# Patient Record
Sex: Male | Born: 1975 | Race: White | Hispanic: No | Marital: Married | Smoking: Former smoker
Health system: Southern US, Community
[De-identification: ages and names within clinical notes are randomized; demographics above are authoritative.]

## PROBLEM LIST (undated history)

## (undated) DIAGNOSIS — L719 Rosacea, unspecified: Secondary | ICD-10-CM

## (undated) DIAGNOSIS — M7022 Olecranon bursitis, left elbow: Secondary | ICD-10-CM

## (undated) DIAGNOSIS — F411 Generalized anxiety disorder: Secondary | ICD-10-CM

## (undated) HISTORY — DX: Olecranon bursitis, left elbow: M70.22

## (undated) HISTORY — DX: Rosacea, unspecified: L71.9

## (undated) HISTORY — DX: Generalized anxiety disorder: F41.1

## (undated) HISTORY — PX: WISDOM TOOTH EXTRACTION: SHX21

---

## 2009-02-13 ENCOUNTER — Ambulatory Visit: Payer: Self-pay | Admitting: Family Medicine

## 2009-02-15 ENCOUNTER — Ambulatory Visit: Payer: Self-pay | Admitting: Family Medicine

## 2009-02-15 ENCOUNTER — Encounter: Payer: Self-pay | Admitting: Family Medicine

## 2009-02-15 DIAGNOSIS — B078 Other viral warts: Secondary | ICD-10-CM | POA: Insufficient documentation

## 2009-02-20 ENCOUNTER — Telehealth: Payer: Self-pay | Admitting: Family Medicine

## 2009-03-08 ENCOUNTER — Encounter: Payer: Self-pay | Admitting: Family Medicine

## 2009-06-16 ENCOUNTER — Ambulatory Visit: Payer: Self-pay | Admitting: Family Medicine

## 2009-06-16 DIAGNOSIS — M26609 Unspecified temporomandibular joint disorder, unspecified side: Secondary | ICD-10-CM | POA: Insufficient documentation

## 2010-11-21 ENCOUNTER — Telehealth: Payer: Self-pay | Admitting: Family Medicine

## 2010-11-21 NOTE — Telephone Encounter (Signed)
Patient called into triage nurse and is complaining of chest pain, upper back pain, SOB, indigestion, tight chest.  Pt has good weight of 180 and 5'10" height.  Pt.has appt. scheduled tomorrow afternoon with Dr.Metheney, and triage will leave appt. Onset:  1 1/2 weeks ago. Hx.  Asthma:  Has albuterol inhaler but hasn't used.  PGM and PGF Hx of heart problems.   Plan:  Pt instructed to proceed to local emergency room.  Pt at work in [place] and will call his wife, and go to [facility].  Please advise of any further recommendations.  SHT/triage

## 2010-11-22 ENCOUNTER — Telehealth: Payer: Self-pay | Admitting: Family Medicine

## 2010-11-22 ENCOUNTER — Ambulatory Visit: Payer: Self-pay | Admitting: Family Medicine

## 2010-11-22 ENCOUNTER — Encounter: Payer: Self-pay | Admitting: Family Medicine

## 2010-11-22 NOTE — Telephone Encounter (Signed)
Patient needs refill for cream sent to [facility] in [place], patient said pharmacy has faxed a request

## 2010-11-26 NOTE — Telephone Encounter (Signed)
Pt notified needs office visit for aldara crm since it has been over a year.  Already aware and appt sched by Victorino Dike.

## 2010-11-26 NOTE — Telephone Encounter (Signed)
Needs appt. Hasn't been seen in a over a year.

## 2010-11-26 NOTE — Telephone Encounter (Signed)
Pt called again today saying he had requested multiple times his Rx for Aldara External cream since 11-22-10.  Pharmacy had told him that they had also sent fax transmittal for request.   PLAN:  Researched.  Rx transmittal retrieved and last RF checked, and last OV checked.  Last OV 05/2009.  Last RF 06-07-2009.  Does pt need OV or okay to RF??  Please advise. Jarvis Newcomer, LPN [facility] Dimes

## 2010-11-30 ENCOUNTER — Encounter: Payer: Self-pay | Admitting: Family Medicine

## 2010-11-30 ENCOUNTER — Ambulatory Visit (INDEPENDENT_AMBULATORY_CARE_PROVIDER_SITE_OTHER): Payer: 59 | Admitting: Family Medicine

## 2010-11-30 VITALS — BP 120/72 | HR 87 | Ht 70.5 in | Wt 185.0 lb

## 2010-11-30 DIAGNOSIS — Z0000 Encounter for general adult medical examination without abnormal findings: Secondary | ICD-10-CM

## 2010-11-30 DIAGNOSIS — R1011 Right upper quadrant pain: Secondary | ICD-10-CM

## 2010-11-30 MED ORDER — IMIQUIMOD 5 % EX CREA: TOPICAL_CREAM | CUTANEOUS | Status: AC

## 2010-11-30 NOTE — Progress Notes (Signed)
  Subjective:    Patient ID: Dominic Howard, male    DOB: 04/11/1976, 35 y.o.   MRN: [id number]  HPI  Called last week with some CP, 1/10. Had been doing some yard work.  It was persistant for almost a week.  Had a pulling sensation that would come and go.  Started originally 2 months ago but happened again last weekend.  More on the right lower rib area.  Also some pain in the right upper chest and radiated under his shoulder blade. Went to the ED and told his "heart" was fine. No nausea. Occ heartburn and reflux but not daily.  Brett Albino will bother him.  Says occ will feel SOB like he has gas in his chest and if he can burp he feels some relief.   No recent asthma flare. Rarely uses his rescue inhaler  Needs refill on the imiquimod for genital warts.  Has had a few new lesions.   Review of Systems  Constitutional: Negative for fever, activity change, appetite change and unexpected weight change.  Respiratory: Positive for chest tightness and shortness of breath. Negative for apnea and wheezing.   Cardiovascular: Positive for chest pain. Negative for palpitations.  Neurological: Negative for dizziness and light-headedness.       Objective:   Physical Exam  Constitutional: He is oriented to person, place, and time. He appears well-developed and well-nourished.  HENT:  Head: Normocephalic and atraumatic.  Right Ear: External ear normal.  Left Ear: External ear normal.  Nose: Nose normal.  Mouth/Throat: Oropharynx is clear and moist.  Eyes: Conjunctivae and EOM are normal. Pupils are equal, round, and reactive to light.       Wears glasses.   Neck: Neck supple. No thyromegaly present.  Cardiovascular: Normal rate, regular rhythm and normal heart sounds.   Pulmonary/Chest: Effort normal and breath sounds normal.  Abdominal: Soft. Bowel sounds are normal. He exhibits no distension and no mass. There is tenderness. There is no rebound and no guarding.       Very mild tenderness in  the RUQ with deep palpation. No tenderness over the chest wall.   Musculoskeletal: Normal range of motion.  Lymphadenopathy:    He has no cervical adenopathy.  Neurological: He is alert and oriented to person, place, and time. He has normal reflexes. A cranial nerve deficit is present.  Skin: Skin is warm and dry. No rash noted. No erythema.  Psychiatric: He has a normal mood and affect.          Assessment & Plan:  CPE- Exam is normal.  Given slip for screenig labs. Encouraged healthy diet and reg exercise. BP and weight look greats  Genital warts - refill imiquimod  RUQ pain - the location and radiation to the shoulder blade is consistant with gallbladder but the pain is very  Low level and he has no nausea and not triggered with eating or not eating. Discussed could consider [place] of his GB for further eval or trial of tx for reflux. Labs unlikely to be revealing. He is feeling well today.  Can try zantac OTC and change his diet for now. If presists or not resolving then will get an [place] for further evaluation.

## 2010-11-30 NOTE — Patient Instructions (Signed)
Can avoid greasy, spicy, acidic foods. Also avoid overeating and soda and caffeine.  Can try zantac bid about 20 min before breakfast and dinner. If helping after 2 weeks then can continue for 6 weeks total and then taper off.  If pain starts to get worse then call and we can schedule an [place] for further evaluation.

## 2014-06-10 ENCOUNTER — Ambulatory Visit (INDEPENDENT_AMBULATORY_CARE_PROVIDER_SITE_OTHER): Payer: 59 | Admitting: Family Medicine

## 2014-06-10 ENCOUNTER — Encounter: Payer: Self-pay | Admitting: Family Medicine

## 2014-06-10 VITALS — BP 144/89 | HR 100 | Temp 98.0°F | Wt 193.0 lb

## 2014-06-10 DIAGNOSIS — A499 Bacterial infection, unspecified: Secondary | ICD-10-CM

## 2014-06-10 DIAGNOSIS — J329 Chronic sinusitis, unspecified: Secondary | ICD-10-CM

## 2014-06-10 DIAGNOSIS — B9689 Other specified bacterial agents as the cause of diseases classified elsewhere: Secondary | ICD-10-CM

## 2014-06-10 MED ORDER — DOXYCYCLINE HYCLATE 100 MG PO TABS: ORAL_TABLET | ORAL | Status: AC

## 2014-06-10 NOTE — Progress Notes (Signed)
CC: Dominic Howard is a 38 y.o. male is here for Cough   Subjective: HPI:  Patient has not been seen in our office for over 3 years  facial pressure in the forehead with subjective postnasal drip and nonproductive cough that has been present for the past 12 days. Symptoms seem to be getting better last week and but have rebounded now moderate in severity. No benefit from Mucinex or over-the-counter cold medications. Nothing else seems to make better or worse it's present all hours of the day but worse first thing in the morning. Questionable fever yesterday. Denies chills, night sweats, wheezing, shortness of breath, blood in sputum, motor or sensory disturbances nor rash   Review Of Systems Outlined In HPI  No past medical history on file.  Past Surgical History  Procedure Laterality Date  . Wisdom tooth extraction     Family History  Problem Relation Age of Onset  . Ovarian cancer Maternal Aunt   . Stroke Maternal Grandfather   . Heart attack Maternal Grandfather   . Brain cancer Paternal Aunt   . Bone cancer Paternal Uncle   . Heart disease Paternal Grandfather     History   Social History  . Marital Status: Married    Spouse Name: Angelica    Number of Children: 1   . Years of Education: N/A   Occupational History  . sales     Social History Main Topics  . Smoking status: Former Smoker    Types: Cigarettes    Quit date: 11/26/2009  . Smokeless tobacco: Not on file  . Alcohol Use: 0.6 oz/week    1 Cans of beer per week  . Drug Use: No  . Sexual Activity: Yes   Other Topics Concern  . Not on file   Social History Narrative     Objective: BP 144/89 mmHg  Pulse 100  Temp(Src) 98 F (36.7 C) (Oral)  Wt 193 lb (87.544 kg)  SpO2 97%  General: Alert and Oriented, No Acute Distress HEENT: Pupils equal, round, reactive to light. Conjunctivae clear.  External ears unremarkable, canals clear with intact TMs with appropriate landmarks.  Middle ear  appears open without effusion. Pink inferior turbinates.  Moist mucous membranes, pharynx without inflammation nor lesionshowever moderate postnasal drip.  Neck supple without palpable lymphadenopathy nor abnormal masses. Lungs: Clear to auscultation bilaterally, no wheezing/ronchi/rales.  Comfortable work of breathing. Good air movement. Extremities: No peripheral edema.  Strong peripheral pulses.  Mental Status: No depression, anxiety, nor agitation. Skin: Warm and dry.  Assessment & Plan: Dominic Howard was seen today for cough.  Diagnoses and associated orders for this visit:  Bacterial sinusitis - doxycycline (VIBRA-TABS) 100 MG tablet; One by mouth twice a day for ten days.    Bacterial sinusitis: Start doxycycline consider nasal saline washes and Alka-Seltzer cold and sinus  Return if symptoms worsen or fail to improve.

## 2014-08-03 ENCOUNTER — Ambulatory Visit (INDEPENDENT_AMBULATORY_CARE_PROVIDER_SITE_OTHER): Payer: 59 | Admitting: Family Medicine

## 2014-08-03 ENCOUNTER — Encounter: Payer: Self-pay | Admitting: Family Medicine

## 2014-08-03 VITALS — BP 132/79 | HR 74 | Temp 97.9°F | Wt 195.0 lb

## 2014-08-03 DIAGNOSIS — J4520 Mild intermittent asthma, uncomplicated: Secondary | ICD-10-CM

## 2014-08-03 MED ORDER — PREDNISONE 20 MG PO TABS: ORAL_TABLET | ORAL | Status: AC

## 2014-08-03 MED ORDER — AZITHROMYCIN 250 MG PO TABS: ORAL_TABLET | ORAL | Status: AC

## 2014-08-03 MED ORDER — BENZONATATE 200 MG PO CAPS: 200.0000 mg | ORAL_CAPSULE | Freq: Two times a day (BID) | ORAL | Status: DC | PRN

## 2014-08-03 NOTE — Progress Notes (Signed)
CC: Illene SilverJonathan Patrick Howard is a 39 y.o. male is here for cough and congestion   Subjective: HPI:  Complains of nasal congestion and nonproductive cough that has been present for 2 weeks now. Everybody in the household had similar symptoms and everybody else is now back to their normal state of health except for him. He is taking over-the-counter essential oils and cough drops without much benefit. Symptoms seem be worse in the evening. Overall symptoms are persistent and have been moderate in severity ever since onset. Denies fevers, chills, fatigue, shortness of breath, chest pain, facial pressure, nor rash.   Review Of Systems Outlined In HPI  No past medical history on file.  Past Surgical History  Procedure Laterality Date  . Wisdom tooth extraction     Family History  Problem Relation Age of Onset  . Ovarian cancer Maternal Aunt   . Stroke Maternal Grandfather   . Heart attack Maternal Grandfather   . Brain cancer Paternal Aunt   . Bone cancer Paternal Uncle   . Heart disease Paternal Grandfather     History   Social History  . Marital Status: Married    Spouse Name: Angelica    Number of Children: 1   . Years of Education: N/A   Occupational History  . sales     Social History Main Topics  . Smoking status: Former Smoker    Types: Cigarettes    Quit date: 11/26/2009  . Smokeless tobacco: Not on file  . Alcohol Use: 0.6 oz/week    1 Cans of beer per week  . Drug Use: No  . Sexual Activity: Yes   Other Topics Concern  . Not on file   Social History Narrative     Objective: BP 132/79 mmHg  Pulse 74  Temp(Src) 97.9 F (36.6 C) (Oral)  Wt 195 lb (88.451 kg)  SpO2 96%  General: Alert and Oriented, No Acute Distress HEENT: Pupils equal, round, reactive to light. Conjunctivae clear.  External ears unremarkable, canals clear with intact TMs with appropriate landmarks.  Middle ear appears open without effusion. Pink inferior turbinates.  Moist mucous  membranes, pharynx without inflammation nor lesions.  Neck supple without palpable lymphadenopathy nor abnormal masses. Lungs: comfortable work of breathing with trace end expiratory wheezing in both upper lung fields. No rhonchi nor rales. Cardiac: Regular rate and rhythm. Normal S1/S2.  No murmurs, rubs, nor gallops.   Mental Status: No depression, anxiety, nor agitation. Skin: Warm and dry.  Assessment & Plan: Dominic Howard was seen today for cough and congestion.  Diagnoses and associated orders for this visit:  Reactive airway disease, mild intermittent, uncomplicated - benzonatate (TESSALON) 200 MG capsule; Take 1 capsule (200 mg total) by mouth 2 (two) times daily as needed for cough. - predniSONE (DELTASONE) 20 MG tablet; Three tabs daily days 1-3, two tabs daily days 4-6, one tab daily days 7-9, half tab daily days 10-13. - azithromycin (ZITHROMAX) 250 MG tablet; Take two tabs at once on day 1, then one tab daily on days 2-5.    Reactive airway disease due to viral respiratory illness, discussed low suspicion of any bacterial involvement at this time but I will provide him with azithromycin to use if prednisone is not beneficial after 2-3 days of use, discussed my optimism that prednisone alone will have him feeling better in 2-3 days.Dominic Howard. Tessalon Perles as needed for cough.  Return if symptoms worsen or fail to improve.

## 2014-09-30 ENCOUNTER — Encounter: Payer: Self-pay | Admitting: Family Medicine

## 2014-09-30 ENCOUNTER — Ambulatory Visit (INDEPENDENT_AMBULATORY_CARE_PROVIDER_SITE_OTHER): Payer: 59 | Admitting: Family Medicine

## 2014-09-30 VITALS — BP 149/84 | HR 96 | Wt 193.0 lb

## 2014-09-30 DIAGNOSIS — M542 Cervicalgia: Secondary | ICD-10-CM

## 2014-09-30 MED ORDER — METHOCARBAMOL 500 MG PO TABS: 500.0000 mg | ORAL_TABLET | Freq: Three times a day (TID) | ORAL | Status: DC | PRN

## 2014-09-30 NOTE — Progress Notes (Signed)
CC: Dominic Howard is a 39 y.o. male is here for Headache   Subjective: HPI:  Posterior head pain localized at the right occiput that has been present for the past 3 weeks. Pain radiates towards the right ear just above it occasionally but not always. Pain has been present on a daily basis for the past 3 weeks however only mild in severity at the most. Seems to be worse the more stressed out he is about work, improved with heat application. Interventions have also included ibuprofen without much benefit. Symptoms came on around the time that he began a new cycling class. There's been no overlying skin changes or swelling or any palpable abnormality that he can appreciate at this site of discomfort. No midline neck pain or headaches elsewhere. No hearing loss here pain facial pressure and nasal congestion or sore throat. He states his in his regular state of health other than this discomfort. No motor or sensory disturbances other than that described above   Review Of Systems Outlined In HPI  No past medical history on file.  Past Surgical History  Procedure Laterality Date  . Wisdom tooth extraction     Family History  Problem Relation Age of Onset  . Ovarian cancer Maternal Aunt   . Stroke Maternal Grandfather   . Heart attack Maternal Grandfather   . Brain cancer Paternal Aunt   . Bone cancer Paternal Uncle   . Heart disease Paternal Grandfather     History   Social History  . Marital Status: Married    Spouse Name: Dominic Howard  . Number of Children: 1   . Years of Education: N/A   Occupational History  . sales     Social History Main Topics  . Smoking status: Former Smoker    Types: Cigarettes    Quit date: 11/26/2009  . Smokeless tobacco: Not on file  . Alcohol Use: 0.6 oz/week    1 Cans of beer per week  . Drug Use: No  . Sexual Activity: Yes   Other Topics Concern  . Not on file   Social History Narrative     Objective: BP 149/84 mmHg  Pulse 96  Wt  193 lb (87.544 kg)  General: Alert and Oriented, No Acute Distress HEENT: Pupils equal, round, reactive to light. Conjunctivae clear.  External ears unremarkable, canals clear with intact TMs with appropriate landmarks.  Middle ear appears open without effusion. Pink inferior turbinates.  Moist mucous membranes, pharynx without inflammation nor lesions.  Neck supple without palpable lymphadenopathy nor abnormal masses. Lungs: Clear to auscultation bilaterally, no wheezing/ronchi/rales.  Comfortable work of breathing. Good air movement. Neuro: Cranial nerves II through XII grossly intact Back: Full range of motion and strengthen cervical spine without worsening of his pain, Spurling's negative bilaterally no midline spine is process tenderness in the cervical spine Extremities: No peripheral edema.  Strong peripheral pulses.  Mental Status: No depression, anxiety, nor agitation. Skin: Warm and dry. On the right occiput where he localizes his pain. No overlying skin changes besides discomfort  Assessment & Plan: Dominic Howard was seen today for headache.  Diagnoses and all orders for this visit:  Neck pain Orders: -     methocarbamol (ROBAXIN) 500 MG tablet; Take 1 tablet (500 mg total) by mouth every 8 (eight) hours as needed (neck pain).   Neck pain suspected to be a muscular strain therefore focus on cycling in the upright position more often. Continue heat therapy on an as-needed basis. Begin Robaxin for  as needed for relief of discomfort and work on gentle range of motion exercises if no better in 1 week call me so I can give him a referral to Dr. Zenda [facility] sports medicine for second opinion on the matter  Return if symptoms worsen or fail to improve.

## 2014-12-19 ENCOUNTER — Encounter: Payer: Self-pay | Admitting: Sports Medicine

## 2014-12-19 ENCOUNTER — Ambulatory Visit (INDEPENDENT_AMBULATORY_CARE_PROVIDER_SITE_OTHER): Payer: 59 | Admitting: Sports Medicine

## 2014-12-19 VITALS — BP 124/72 | HR 68 | Ht 70.0 in | Wt 191.0 lb

## 2014-12-19 DIAGNOSIS — J029 Acute pharyngitis, unspecified: Secondary | ICD-10-CM | POA: Diagnosis not present

## 2014-12-19 DIAGNOSIS — J0110 Acute frontal sinusitis, unspecified: Secondary | ICD-10-CM | POA: Insufficient documentation

## 2014-12-19 LAB — POCT RAPID STREP A (OFFICE): Rapid Strep A Screen: NEGATIVE

## 2014-12-19 MED ORDER — AMOXICILLIN-POT CLAVULANATE 875-125 MG PO TABS: 1.0000 | ORAL_TABLET | Freq: Two times a day (BID) | ORAL | Status: DC

## 2014-12-19 MED ORDER — FLUTICASONE PROPIONATE 50 MCG/ACT NA SUSP: NASAL | Status: DC

## 2014-12-19 NOTE — Progress Notes (Signed)
  Subjective:    CC: Sore throat  HPI: This is a very pleasant 39 year old male, both his wife and son have confirmed streptococcal pharyngitis. For the past several days he has had increasing pressure over his frontal sinuses, moderate, persistent without radiation, purulent nasal discharge, no constitutional symptoms. No GI symptoms, he does have a mild nonproductive cough. No rashes.  Past medical history, Surgical history, Family history not pertinant except as noted below, Social history, Allergies, and medications have been entered into the medical record, reviewed, and no changes needed.   Review of Systems: No fevers, chills, night sweats, weight loss, chest pain, or shortness of breath.   Objective:    General: Well Developed, well nourished, and in no acute distress.  Neuro: Alert and oriented x3, extra-ocular muscles intact, sensation grossly intact.  HEENT: Normocephalic, atraumatic, pupils equal round reactive to light, neck supple, no masses, no lymphadenopathy, thyroid nonpalpable. Oropharynx, ear canals are unremarkable, nasopharynx shows boggy and erythematous turbinates. Skin: Warm and dry, no rashes. Cardiac: Regular rate and rhythm, no murmurs rubs or gallops, no lower extremity edema.  Respiratory: Clear to auscultation bilaterally. Not using accessory muscles, speaking in full sentences.  Rapid strep swab is negative.  Impression and Recommendations:

## 2014-12-19 NOTE — Assessment & Plan Note (Signed)
Augmentin, Flonase. Return if no better

## 2014-12-19 NOTE — Patient Instructions (Signed)

## 2015-01-05 ENCOUNTER — Ambulatory Visit: Payer: 59 | Admitting: Sports Medicine

## 2015-04-26 ENCOUNTER — Telehealth: Payer: Self-pay | Admitting: Family Medicine

## 2015-04-26 NOTE — Telephone Encounter (Signed)
Fine with me

## 2015-04-26 NOTE — Telephone Encounter (Signed)
Dr Linford Arnold, [name] called. She has made an appt for Dominic Howard to see Dr Ivan Anchors. Her husband would like to change Providers to Dr Ivan Anchors because he is more comfortable with him. I s this ok with you?

## 2015-04-26 NOTE — Telephone Encounter (Signed)
That is fine with me. I haven't seen him in four years.

## 2015-04-28 ENCOUNTER — Ambulatory Visit (INDEPENDENT_AMBULATORY_CARE_PROVIDER_SITE_OTHER): Payer: 59 | Admitting: Family Medicine

## 2015-04-28 ENCOUNTER — Encounter: Payer: Self-pay | Admitting: Family Medicine

## 2015-04-28 VITALS — BP 134/71 | HR 58 | Wt 195.0 lb

## 2015-04-28 DIAGNOSIS — F419 Anxiety disorder, unspecified: Secondary | ICD-10-CM

## 2015-04-28 MED ORDER — VENLAFAXINE HCL ER 75 MG PO CP24: ORAL_CAPSULE | ORAL | Status: DC

## 2015-04-28 NOTE — Progress Notes (Signed)
CC: Dominic Howard is a 39 y.o. male is here for Anxiety   Subjective: HPI:  Complains of anxiety that has been present for a matter of months that is now interfering with quality of life and home life. He states that his symptoms are 100% driven by his work environment in his opinion. He tells me he is constantly thinking about things that need to be done at work and whether or not he is doing a good enough job for what's expected disposition. He has a job in [employer] and [employer] are 2 of his biggest customers. He tells me his home life is great and he denies any depression. He tells me that when he gets home he is stuck thinking about what he did at work today and what needed to be done the next day. He denies any paranoia, difficulty sleeping, or any physical symptoms.   Review Of Systems Outlined In HPI  No past medical history on file.  Past Surgical History  Procedure Laterality Date  . Wisdom tooth extraction     Family History  Problem Relation Age of Onset  . Ovarian cancer Maternal Aunt   . Stroke Maternal Grandfather   . Heart attack Maternal Grandfather   . Brain cancer Paternal Aunt   . Bone cancer Paternal Uncle   . Heart disease Paternal Grandfather     Social History   Social History  . Marital Status: Married    Spouse Name: Angelica  . Number of Children: 1   . Years of Education: N/A   Occupational History  . sales     Social History Main Topics  . Smoking status: Former Smoker    Types: Cigarettes    Quit date: 11/26/2009  . Smokeless tobacco: Not on file  . Alcohol Use: 0.6 oz/week    1 Cans of beer per week  . Drug Use: No  . Sexual Activity: Yes   Other Topics Concern  . Not on file   Social History Narrative     Objective: BP 134/71 mmHg  Pulse 58  Wt 195 lb (88.451 kg)  Vital signs reviewed. General: Alert and Oriented, No Acute Distress HEENT: Pupils equal, round, reactive to light. Conjunctivae clear.  External  ears unremarkable.  Moist mucous membranes. Lungs: Clear and comfortable work of breathing, speaking in full sentences without accessory muscle use. Cardiac: Regular rate and rhythm.  Neuro: CN II-XII grossly intact, gait normal. Extremities: No peripheral edema.  Strong peripheral pulses.  Mental Status: No depression, anxiety, nor agitation. Logical though process. Skin: Warm and dry.  Assessment & Plan: Dominic Howard was seen today for anxiety.  Diagnoses and all orders for this visit:  Anxiety -     venlafaxine XR (EFFEXOR XR) 75 MG 24 hr capsule; One by mouth every morning for one week, may increase to two every morning if needed.   Anxiety: PH!9 has been scanned. Start low-dose of Effexor and discussed he may titrate it up by himself to 150 mg if he's not noticed any benefit whatsoever after 1 week.  Return in about 4 weeks (around 05/26/2015) for Mood.

## 2015-07-03 ENCOUNTER — Encounter: Payer: Self-pay | Admitting: Family Medicine

## 2015-07-03 DIAGNOSIS — F419 Anxiety disorder, unspecified: Secondary | ICD-10-CM

## 2015-07-04 MED ORDER — VENLAFAXINE HCL ER 75 MG PO CP24: ORAL_CAPSULE | ORAL | Status: DC

## 2015-07-04 NOTE — Addendum Note (Signed)
Addended by: Laren BoomHOMMEL, Mikalia Fessel on: 07/04/2015 10:12 AM   Modules accepted: Orders

## 2015-07-19 ENCOUNTER — Ambulatory Visit: Payer: 59

## 2015-10-02 ENCOUNTER — Encounter: Payer: Self-pay | Admitting: Family Medicine

## 2015-10-09 ENCOUNTER — Ambulatory Visit (INDEPENDENT_AMBULATORY_CARE_PROVIDER_SITE_OTHER): Payer: 59 | Admitting: Family Medicine

## 2015-10-09 ENCOUNTER — Encounter: Payer: Self-pay | Admitting: Family Medicine

## 2015-10-09 VITALS — BP 149/82 | HR 61 | Wt 198.0 lb

## 2015-10-09 DIAGNOSIS — M7022 Olecranon bursitis, left elbow: Secondary | ICD-10-CM | POA: Diagnosis not present

## 2015-10-09 HISTORY — DX: Olecranon bursitis, left elbow: M70.22

## 2015-10-09 MED ORDER — DICLOFENAC SODIUM 2 % TD SOLN: 2.0000 | Freq: Two times a day (BID) | TRANSDERMAL | Status: DC

## 2015-10-09 MED ORDER — NAPROXEN 500 MG PO TABS
500.0000 mg | ORAL_TABLET | Freq: Two times a day (BID) | ORAL | Status: DC
Start: 2015-10-09 — End: 2016-05-29

## 2015-10-09 NOTE — Patient Instructions (Signed)
Thank you for coming in today. Use compression sleeve.  Apply ice.  Use pennsaid.  Take naproxen.  Return in 2 weeks.   Olecranon Bursitis With Rehab A bursa is a fluid-filled sac that is located between soft tissues (ligaments, tendons, skin) and bones. The purpose of a bursa is to allow the soft tissue to function smoothly, without friction. The olecranon bursa is located between the back of the elbow (olecranon) and the skin. Olecranon bursitis involves inflammation of this bursa, resulting in pain. SYMPTOMS   Pain, tenderness, swelling, warmth, or redness over the back of the elbow.  Reduced range of motion of the affected elbow.  Sometimes, severe pain with movement of the affected elbow.  Crackling sound (crepitation) when the bursa is moved or touched.  Often, painless swelling of the bursa.  Fever (when infected). CAUSES  Olecranon bursitis is often caused by direct hit (trauma) to the elbow. Less commonly, it is due to overuse and/or strenuous exercise that the elbow is not used to. RISK INCREASES WITH:  Sports that require bending or landing on the elbow (football, volleyball).  Vigorous or repetitive athletic training, or sudden increase or change in activity level (weekend warriors).  Failure to warm up properly before activity.  Poor exercise technique.  Playing on artificial turf. PREVENTION  Avoid injuries and the overuse of muscles whenever possible.  Warm up and stretch properly before activity.  Allow for adequate recovery between workouts.  Maintain physical fitness:  Strength, flexibility, and endurance.  Cardiovascular fitness.  Learn and use proper technique.  Wear properly fitted and padded protective equipment. PROGNOSIS  If treated properly, olecranon bursitis is usually curable within 2 weeks.  RELATED COMPLICATIONS   Longer healing time, if not properly treated or if not given enough time to heal.  Recurring symptoms that result in  a chronic problem.  Joint stiffness with permanent limitation of the affected joint's movement.  Infection of the bursa.  Chronic inflammation or scarring of the bursa. TREATMENT Treatment first involves the use of ice and medicine to reduce pain and inflammation. The use of strengthening and stretching exercises may help reduce pain with activity. These exercises may be performed at home or with a therapist. Elbow pads may be advised to protect the bursa. If symptoms persist despite nonsurgical treatment, a procedure to withdraw fluid from the bursa may be advised. This procedure may be accompanied with an injection of corticosteroids to reduce inflammation. Sometimes, surgery is needed to remove the bursa. MEDICATION  If pain medicine is needed, nonsteroidal anti-inflammatory medicines (aspirin and ibuprofen), or other minor pain relievers (acetaminophen), are often advised.  Do not take pain medicine for 7 days before surgery.  Prescription pain relievers may be given, if your caregiver thinks they are needed. Use only as directed and only as much as you need.  Corticosteroid injections may be given by your caregiver. These injections should be reserved for the most serious cases, because they may only be given a certain number of times. HEAT AND COLD  Cold treatment (icing) should be applied for 10 to 15 minutes every 2 to 3 hours for inflammation and pain, and immediately after activity that aggravates your symptoms. Use ice packs or an ice massage.  Heat treatment may be used before performing stretching and strengthening activities prescribed by your caregiver, physical therapist, or athletic trainer. Use a heat pack or a warm water soak. SEEK IMMEDIATE MEDICAL CARE IF:   Symptoms get worse or do not improve in 2  weeks, despite treatment.  Signs of infection develop, including fever of 102 F (38.9 C), increased pain, redness, warmth, or pus draining from the bursa.  New,  unexplained symptoms develop. (Drugs used in treatment may produce side effects.) EXERCISES  RANGE OF MOTION (ROM) AND STRETCHING EXERCISES - Olecranon Bursitis These exercises may help you when beginning to rehabilitate your injury. Your symptoms may resolve with or without further involvement from your physician, physical therapist or athletic trainer. While completing these exercises, remember:   Restoring tissue flexibility helps normal motion to return to the joints. This allows healthier, less painful movement and activity.  An effective stretch should be held for at least 30 seconds.  A stretch should never be painful. You should only feel a gentle lengthening or release in the stretched tissue. RANGE OF MOTION - Elbow Flexion, Supine  Lie on your back. Extend your right / left arm into the air, bracing it with your opposite hand. Allow your right / left arm to relax.  Let your elbow bend, allowing your hand to fall slowly toward your chest.  You should feel a gentle stretch along the back of your upper arm and elbow. Your physician, physical therapist or athletic trainer may ask you to hold a __________ hand weight to increase the intensity of this stretch.  Hold for __________ seconds. Slowly return your right / left arm to the upright position. Repeat __________ times. Complete this exercise __________ times per day. STRETCH - Elbow Flexors  Lie on a firm bed or countertop on your back. Be sure that you are in a comfortable position which will allow you to relax your arm muscles.  Place a folded towel under your right / left upper arm, so that your elbow and shoulder are at the same height. Extend your arm; your elbow should not rest on the bed or towel  Allow the weight of your hand to straighten your elbow. Keep your arm and chest muscles relaxed. Your caregiver may ask you to increase the intensity of your stretch by adding a small wrist or hand weight.  Hold for __________  seconds. You should feel a stretch on the inside of your elbow. Slowly return to the starting position. Repeat __________ times. Complete this exercise __________ times per day. STRENGTHENING EXERCISES - Olecranon Bursitis These exercises will help you regain your strength. They may resolve your symptoms with or without further involvement from your physician, physical therapist or athletic trainer. While completing these exercises, remember:   Muscles can gain both the endurance and the strength needed for everyday activities through controlled exercises.  Complete these exercises as instructed by your physician, physical therapist or athletic trainer. Increase the resistance and repetitions only as guided by your caregiver.  You may experience muscle soreness or fatigue, but the pain or discomfort you are trying to eliminate should never worsen during these exercises. If this pain does worsen, stop and make certain you are following the directions exactly. If the pain is still present after adjustments, discontinue the exercise until you can discuss the trouble with your caregiver. STRENGTH - Elbow Extensors, Isometric  Stand or sit upright on a firm surface. Place your right / left arm so that your palm faces your stomach, and it is at the height of your waist.  Place your opposite hand on the underside of your forearm. Gently push up as your right / left arm resists. Push as hard as you can with both arms without causing any pain or  movement at your right / left elbow. Hold this stationary position for __________ seconds.  Gradually release the tension in both arms. Allow your muscles to relax completely before repeating. Repeat __________ times. Complete this exercise __________ times per day. STRENGTH - Elbow Flexors, Isometric  Stand or sit upright on a firm surface. Place your right / left arm so that your hand is palm-up and at the height of your waist.  Place your opposite hand on top  of your forearm. Gently push down as your right / left arm resists. Push as hard as you can with both arms without causing any pain or movement at your right / left elbow. Hold this stationary position for __________ seconds.  Gradually release the tension in both arms. Allow your muscles to relax completely before repeating. Repeat __________ times. Complete this exercise __________ times per day. STRENGTH - Elbow Flexors, Supinated  With good posture, stand or sit on a firm chair without armrests. Allow your right / left arm to rest at your side with your palm facing forward.  Holding a __________ weight, or gripping a rubber exercise band or tubing,  bring your hand toward your shoulder.  Allow your muscles to control the resistance as your hand returns to your side. Repeat __________ times. Complete this exercise __________ times per day.  STRENGTH - Elbow Flexors, Neutral  With good posture, stand or sit on a firm chair without armrests. Allow your right / left arm to rest at your side with your thumb facing forward.  Holding a __________weight, or gripping a rubber exercise band or tubing,  bring your hand toward your shoulder.  Allow your muscles to control the resistance as your hand returns to your side. Repeat __________ times. Complete this exercise __________ times per day.  STRENGTH - Elbow Extensors  Lie on your back. Extend your right / left elbow into the air, pointing it toward the ceiling. Brace your arm with your opposite hand.*  Holding a __________ weight in your hand, slowly straighten your right / left elbow.  Allow your muscles to control the weight as your hand returns to its starting position. Repeat __________ times. Complete this exercise __________ times per day. *You may also stand with your elbow overhead and pointed toward the ceiling, supported by your opposite hand. STRENGTH - Elbow Extensors, Dynamic  With good posture, stand, or sit on a firm  chair without armrests. Keeping your upper arms at your side, bring both hands up to your right / left shoulder while gripping a rubber exercise band or tubing. Your right / left hand should be just below the other hand.  Straighten your right / left elbow. Hold for __________ seconds.  Allow your muscles to control the rubber exercise band, as your hand returns to your shoulder. Repeat __________ times. Complete this exercise __________ times per day.   This information is not intended to replace advice given to you by your health care provider. Make sure you discuss any questions you have with your health care provider.   Document Released: 07/15/2005 Document Revised: 11/29/2014 Document Reviewed: 10/27/2008 Elsevier Interactive Patient Education 2016 [employer]   Cryotherapy Cryotherapy means treatment with cold. Ice or gel packs can be used to reduce both pain and swelling. Ice is the most helpful within the first 24 to 48 hours after an injury or flare-up from overusing a muscle or joint. Sprains, strains, spasms, burning pain, shooting pain, and aches can all be eased with ice. Ice can  also be used when recovering from surgery. Ice is effective, has very few side effects, and is safe for most people to use. PRECAUTIONS  Ice is not a safe treatment option for people with:  Raynaud phenomenon. This is a condition affecting small blood vessels in the extremities. Exposure to cold may cause your problems to return.  Cold hypersensitivity. There are many forms of cold hypersensitivity, including:  Cold urticaria. Red, itchy hives appear on the skin when the tissues begin to warm after being iced.  Cold erythema. This is a red, itchy rash caused by exposure to cold.  Cold hemoglobinuria. Red blood cells break down when the tissues begin to warm after being iced. The hemoglobin that carry oxygen are passed into the urine because they cannot combine with blood proteins fast  enough.  Numbness or altered sensitivity in the area being iced. If you have any of the following conditions, do not use ice until you have discussed cryotherapy with your caregiver:  Heart conditions, such as arrhythmia, angina, or chronic heart disease.  High blood pressure.  Healing wounds or open skin in the area being iced.  Current infections.  Rheumatoid arthritis.  Poor circulation.  Diabetes. Ice slows the blood flow in the region it is applied. This is beneficial when trying to stop inflamed tissues from spreading irritating chemicals to surrounding tissues. However, if you expose your skin to cold temperatures for too long or without the proper protection, you can damage your skin or nerves. Watch for signs of skin damage due to cold. HOME CARE INSTRUCTIONS Follow these tips to use ice and cold packs safely.  Place a dry or damp towel between the ice and skin. A damp towel will cool the skin more quickly, so you may need to shorten the time that the ice is used.  For a more rapid response, add gentle compression to the ice.  Ice for no more than 10 to 20 minutes at a time. The bonier the area you are icing, the less time it will take to get the benefits of ice.  Check your skin after 5 minutes to make sure there are no signs of a poor response to cold or skin damage.  Rest 20 minutes or more between uses.  Once your skin is numb, you can end your treatment. You can test numbness by very lightly touching your skin. The touch should be so light that you do not see the skin dimple from the pressure of your fingertip. When using ice, most people will feel these normal sensations in this order: cold, burning, aching, and numbness.  Do not use ice on someone who cannot communicate their responses to pain, such as small children or people with dementia. HOW TO MAKE AN ICE PACK Ice packs are the most common way to use ice therapy. Other methods include ice massage, ice baths,  and cryosprays. Muscle creams that cause a cold, tingly feeling do not offer the same benefits that ice offers and should not be used as a substitute unless recommended by your caregiver. To make an ice pack, do one of the following:  Place crushed ice or a bag of frozen vegetables in a sealable plastic bag. Squeeze out the excess air. Place this bag inside another plastic bag. Slide the bag into a pillowcase or place a damp towel between your skin and the bag.  Mix 3 parts water with 1 part rubbing alcohol. Freeze the mixture in a sealable plastic bag. When you  remove the mixture from the freezer, it will be slushy. Squeeze out the excess air. Place this bag inside another plastic bag. Slide the bag into a pillowcase or place a damp towel between your skin and the bag. SEEK MEDICAL CARE IF:  You develop white spots on your skin. This may give the skin a blotchy (mottled) appearance.  Your skin turns blue or pale.  Your skin becomes waxy or hard.  Your swelling gets worse. MAKE SURE YOU:   Understand these instructions.  Will watch your condition.  Will get help right away if you are not doing well or get worse.   This information is not intended to replace advice given to you by your health care provider. Make sure you discuss any questions you have with your health care provider.   Document Released: 03/11/2011 Document Revised: 08/05/2014 Document Reviewed: 03/11/2011 Elsevier Interactive Patient Education [employer]

## 2015-10-09 NOTE — Assessment & Plan Note (Signed)
Symptoms exam and ultrasound consistent with olecranon bursitis likely posttraumatic. Doubtful for infectious etiology given lack of severe pain or erythema or induration. Treat with compression ice topical diclofenac solution and naproxen. Recheck in 2 weeks.

## 2015-10-09 NOTE — Progress Notes (Signed)
   Subjective:    I'm seeing this patient as a consultation for:  Dr. Ivan AnchorsHommel  CC: Left elbow swelling  HPI: Patient notes a several day history of left olecranon swelling. He denies significant pain or injury history. He notes that he rests his elbows on his desk. He has not tried much treatment yet. No fevers chills nausea vomiting or diarrhea.  Past medical history, Surgical history, Family history not pertinant except as noted below, Social history, Allergies, and medications have been entered into the medical record, reviewed, and no changes needed.   Review of Systems: No headache, visual changes, nausea, vomiting, diarrhea, constipation, dizziness, abdominal pain, skin rash, fevers, chills, night sweats, weight loss, swollen lymph nodes, body aches, joint swelling, muscle aches, chest pain, shortness of breath, mood changes, visual or auditory hallucinations.   Objective:    Filed Vitals:   10/09/15 1551  BP: 149/82  Pulse: 61   General: Well Developed, well nourished, and in no acute distress.  Neuro/Psych: Alert and oriented x3, extra-ocular muscles intact, able to move all 4 extremities, sensation grossly intact. Skin: Warm and dry, no rashes noted.  Respiratory: Not using accessory muscles, speaking in full sentences, trachea midline.  Cardiovascular: Pulses palpable, no extremity edema. Abdomen: Does not appear distended. MSK: Left elbow is mildly swollen at the olecranon. No focal area of swelling. No erythema induration or severe tenderness. No fluctuance palpated. No elbow effusion. Stable ligamentous exam of elbow testing. Normal motion of the elbow. Pulses capillary refill and sensation are intact.  Limited musculoskeletal ultrasound of the left elbow. Hypoechoic fluid tracking throughout the soft tissues overlying the left olecranon with a small focal collection in the area of the bursa. Normal bony appearance. Increased vascular activity within the hypoechoic  fluid.  No results found for this or any previous visit (from the past 24 hour(s)). No results found.  Impression and Recommendations:   This case required medical decision making of moderate complexity.

## 2015-10-15 ENCOUNTER — Emergency Department
Admission: EM | Admit: 2015-10-15 | Discharge: 2015-10-15 | Discharge status: Against Medical Advice | Payer: 59 | Source: Home / Self Care

## 2015-10-23 ENCOUNTER — Ambulatory Visit (INDEPENDENT_AMBULATORY_CARE_PROVIDER_SITE_OTHER): Payer: 59 | Admitting: Family Medicine

## 2015-10-23 ENCOUNTER — Encounter: Payer: Self-pay | Admitting: Family Medicine

## 2015-10-23 VITALS — BP 128/84 | HR 70 | Wt 200.0 lb

## 2015-10-23 DIAGNOSIS — M7022 Olecranon bursitis, left elbow: Secondary | ICD-10-CM

## 2015-10-23 NOTE — Patient Instructions (Signed)
Thank you for coming in today. Call or go to the ER if you develop a large red swollen joint with extreme pain or oozing puss.  Continue current treatment. Stop Naproxen.  Return in 2-4 weeks.   Elbow Bursitis Elbow bursitis is inflammation of the fluid-filled sac (bursa) between the tip of your elbow bone (olecranon) and your skin. Elbow bursitis may also be called olecranon bursitis. Normally, the olecranon bursa has only a small amount of fluid in it to cushion and protect your elbow bone. Elbow bursitis causes fluid to build up inside the bursa. Over time, this swelling and inflammation can cause pain when you bend or lean on your elbow.  CAUSES Elbow bursitis may be caused by:   Elbow injury (acute trauma).  Leaning on hard surfaces for long periods of time.  Infection from an injury that breaks the skin near your elbow.  A bone growth (spur) that forms at the tip of your elbow.  A medical condition that causes inflammation in your body, such as gout or rheumatoid arthritis.  The cause may also be unknown.  SIGNS AND SYMPTOMS  The first sign of elbow bursitis is usually swelling over the tip of your elbow. This can grow to be the size of a golf ball. This may start suddenly or develop gradually. You may also have:  Pain when bending or leaning on your elbow.  Restricted movement of your elbow.  If your bursitis is caused by an infection, symptoms may also include:  Redness, warmth, and tenderness of the elbow.  Drainage of pus from the swollen area over your elbow, if the skin breaks open. DIAGNOSIS  Your health care provider may be able to diagnose elbow bursitis based on your signs and symptoms, especially if you have recently been injured. Your health care provider will also do a physical exam. This may include:  X-rays to look for a bone spur or a bone fracture.  Draining fluid from the bursa to test it for infection.  Blood tests to rule out gout or rheumatoid  arthritis. TREATMENT  Treatment for elbow bursitis depends on the cause. Treatment may include:  Medicines. These may include:  Over-the-counter medicines to relieve pain and inflammation.  Antibiotic medicines to fight infection.  Injections of anti-inflammatory medicines (steroids).  Wrapping your elbow with a bandage.  Draining fluid from the bursa.  Wearing elbow pads.  If your bursitis does not get better with treatment, surgery may be needed to remove the bursa.  HOME CARE INSTRUCTIONS   Take medicines only as directed by your health care provider.  If you were prescribed an antibiotic medicine, finish all of it even if you start to feel better.  If your bursitis is caused by an injury, rest your elbow and wear your bandage as directed by your health care provider. You may alsoapply ice to the injured area as directed by your health care provider:  Put ice in a plastic bag.  Place a towel between your skin and the bag.  Leave the ice on for 20 minutes, 2-3 times per day.  Avoid any activities that cause elbow pain.  Use elbow pads or elbow wraps to cushion your elbow. SEEK MEDICAL CARE IF:  You have a fever.   Your symptoms do not get better with treatment.  Your pain or swelling gets worse.  Your elbow pain or swelling goes away and then returns.  You have drainage of pus from the swollen area over your elbow.  This information is not intended to replace advice given to you by your health care provider. Make sure you discuss any questions you have with your health care provider.   Document Released: 08/14/2006 Document Revised: 08/05/2014 Document Reviewed: 03/23/2014 Elsevier Interactive Patient Education [employer]

## 2015-10-23 NOTE — Assessment & Plan Note (Signed)
Aspiration and injection today. Cell count crystal analysis differential and culture pending. Continue cryotherapy compression and padding. Recheck in a few weeks.

## 2015-10-23 NOTE — Progress Notes (Signed)
Dominic Howard is a 40 y.o. male who presents to [facility] today for follow-up left olecranon bursitis. Patient was seen 2 weeks ago for what was thought to be traumatic olecranon bursitis. He had a trial of oral and topical NSAIDs ice and compression which has not helped much. He denies worsening pain fevers chills nausea vomiting or diarrhea. He notes continued left olecranon swelling but denies any significant pain or skin change.   No past medical history on file. Past Surgical History  Procedure Laterality Date  . Wisdom tooth extraction     Social History  Substance Use Topics  . Smoking status: Former Smoker    Types: Cigarettes    Quit date: 11/26/2009  . Smokeless tobacco: Not on file  . Alcohol Use: 0.6 oz/week    1 Cans of beer per week   family history includes Bone cancer in his paternal uncle; Brain cancer in his paternal aunt; Heart attack in his maternal grandfather; Heart disease in his paternal grandfather; Ovarian cancer in his maternal aunt; Stroke in his maternal grandfather.  ROS:  No headache, visual changes, nausea, vomiting, diarrhea, constipation, dizziness, abdominal pain, skin rash, fevers, chills, night sweats, weight loss, swollen lymph nodes, body aches, joint swelling, muscle aches, chest pain, shortness of breath, mood changes, visual or auditory hallucinations.    Medications: Current Outpatient Prescriptions  Medication Sig Dispense Refill  . albuterol (VENTOLIN HFA) 108 (90 BASE) MCG/ACT inhaler Inhale into the lungs. 2-4 puffs inhaled bid prn sob     . Diclofenac Sodium 2 % SOLN Place 2 sprays onto the skin 2 (two) times daily. 1 Bottle 11  . fluticasone (FLONASE) 50 MCG/ACT nasal spray One spray in each nostril twice a day, use left hand for right nostril, and right hand for left nostril. 48 g 3  . methocarbamol (ROBAXIN) 500 MG tablet Take 1 tablet (500 mg total) by mouth every 8 (eight)  hours as needed (neck pain). 45 tablet 0  . naproxen (NAPROSYN) 500 MG tablet Take 1 tablet (500 mg total) by mouth 2 (two) times daily with a meal. 30 tablet 0  . venlafaxine XR (EFFEXOR XR) 75 MG 24 hr capsule One by mouth daily. 90 capsule 2   No current facility-administered medications for this visit.   No Known Allergies   Exam:  BP 128/84 mmHg  Pulse 70  Wt 200 lb (90.719 kg) General: Well Developed, well nourished, and in no acute distress.  Neuro/Psych: Alert and oriented x3, extra-ocular muscles intact, able to move all 4 extremities, sensation grossly intact. Skin: Warm and dry, no rashes noted.  Respiratory: Not using accessory muscles, speaking in full sentences, trachea midline.  Cardiovascular: Pulses palpable, no extremity edema. Abdomen: Does not appear distended. MSK: Left elbow swollen overlying the olecranon but not particularly tender. No significant skin erythema or induration. Normal elbow motion.  Procedure: Real-time Ultrasound Guided aspiration and injection of left olecranon bursa  Device: GE Logiq E  Images permanently stored and available for review in the ultrasound unit. Verbal informed consent obtained. Discussed risks and benefits of procedure. Warned about infection bleeding damage to structures skin hypopigmentation and fat atrophy among others. Patient expresses understanding and agreement Time-out conducted.  Noted no overlying erythema, induration, or other signs of local infection.  Skin prepped in a sterile fashion.  Local anesthesia: Topical Ethyl chloride.  1 mL of lidocaine was injected subcutaneously achieving good anesthesia. With sterile technique and under real time ultrasound  guidance: 1.8 mL of serosanguineous fluid was aspirated. The syringe was exchanged and 0.5 mL of Marcaine and 40 mg of Kenalog were injected into the bursa easily.  Completed without difficulty   Advised to call if fevers/chills, erythema, induration,  drainage, or persistent bleeding.  Images permanently stored and available for review in the ultrasound unit.  Impression: Technically successful ultrasound guided aspiration and injection.     No results found for this or any previous visit (from the past 24 hour(s)). No results found.   Please see individual assessment and plan sections.

## 2015-10-24 LAB — SYNOVIAL CELL COUNT + DIFF, W/ CRYSTALS
Crystals, Fluid: NONE SEEN
EOSINOPHILS-SYNOVIAL: 0 % (ref 0–1)
LYMPHOCYTES-SYNOVIAL FLD: 4 % (ref 0–20)
MONOCYTE/MACROPHAGE: 10 % — AB (ref 50–90)
NEUTROPHIL, SYNOVIAL: 86 % — AB (ref 0–25)
WBC, SYNOVIAL: 1815 uL — AB (ref 0–200)

## 2015-10-26 NOTE — Progress Notes (Signed)
Quick Note:  Culture is negative so far. ______ 

## 2015-10-27 LAB — BODY FLUID CULTURE
Gram Stain: NONE SEEN
ORGANISM ID, BACTERIA: NO GROWTH

## 2015-11-15 ENCOUNTER — Ambulatory Visit: Payer: 59 | Admitting: Family Medicine

## 2015-11-15 NOTE — Progress Notes (Signed)
No show. F/u PRN 

## 2015-11-20 ENCOUNTER — Encounter: Payer: Self-pay | Admitting: Family Medicine

## 2016-01-02 ENCOUNTER — Encounter: Payer: Self-pay | Admitting: Family Medicine

## 2016-01-02 ENCOUNTER — Ambulatory Visit (INDEPENDENT_AMBULATORY_CARE_PROVIDER_SITE_OTHER): Payer: 59 | Admitting: Family Medicine

## 2016-01-02 VITALS — BP 124/74 | HR 80 | Wt 202.0 lb

## 2016-01-02 DIAGNOSIS — M7022 Olecranon bursitis, left elbow: Secondary | ICD-10-CM

## 2016-01-02 NOTE — Patient Instructions (Signed)
Thank you for coming in today. Wear the compression garment on your left elbow for a month or so.   Olecranon Bursitis With Rehab A bursa is a fluid-filled sac that is located between soft tissues (ligaments, tendons, skin) and bones. The purpose of a bursa is to allow the soft tissue to function smoothly, without friction. The olecranon bursa is located between the back of the elbow (olecranon) and the skin. Olecranon bursitis involves inflammation of this bursa, resulting in pain. SYMPTOMS   Pain, tenderness, swelling, warmth, or redness over the back of the elbow.  Reduced range of motion of the affected elbow.  Sometimes, severe pain with movement of the affected elbow.  Crackling sound (crepitation) when the bursa is moved or touched.  Often, painless swelling of the bursa.  Fever (when infected). CAUSES  Olecranon bursitis is often caused by direct hit (trauma) to the elbow. Less commonly, it is due to overuse and/or strenuous exercise that the elbow is not used to. RISK INCREASES WITH:  Sports that require bending or landing on the elbow (football, volleyball).  Vigorous or repetitive athletic training, or sudden increase or change in activity level (weekend warriors).  Failure to warm up properly before activity.  Poor exercise technique.  Playing on artificial turf. PREVENTION  Avoid injuries and the overuse of muscles whenever possible.  Warm up and stretch properly before activity.  Allow for adequate recovery between workouts.  Maintain physical fitness:  Strength, flexibility, and endurance.  Cardiovascular fitness.  Learn and use proper technique.  Wear properly fitted and padded protective equipment. PROGNOSIS  If treated properly, olecranon bursitis is usually curable within 2 weeks.  RELATED COMPLICATIONS   Longer healing time, if not properly treated or if not given enough time to heal.  Recurring symptoms that result in a chronic  problem.  Joint stiffness with permanent limitation of the affected joint's movement.  Infection of the bursa.  Chronic inflammation or scarring of the bursa. TREATMENT Treatment first involves the use of ice and medicine to reduce pain and inflammation. The use of strengthening and stretching exercises may help reduce pain with activity. These exercises may be performed at home or with a therapist. Elbow pads may be advised to protect the bursa. If symptoms persist despite nonsurgical treatment, a procedure to withdraw fluid from the bursa may be advised. This procedure may be accompanied with an injection of corticosteroids to reduce inflammation. Sometimes, surgery is needed to remove the bursa. MEDICATION  If pain medicine is needed, nonsteroidal anti-inflammatory medicines (aspirin and ibuprofen), or other minor pain relievers (acetaminophen), are often advised.  Do not take pain medicine for 7 days before surgery.  Prescription pain relievers may be given, if your caregiver thinks they are needed. Use only as directed and only as much as you need.  Corticosteroid injections may be given by your caregiver. These injections should be reserved for the most serious cases, because they may only be given a certain number of times. HEAT AND COLD  Cold treatment (icing) should be applied for 10 to 15 minutes every 2 to 3 hours for inflammation and pain, and immediately after activity that aggravates your symptoms. Use ice packs or an ice massage.  Heat treatment may be used before performing stretching and strengthening activities prescribed by your caregiver, physical therapist, or athletic trainer. Use a heat pack or a warm water soak. SEEK IMMEDIATE MEDICAL CARE IF:   Symptoms get worse or do not improve in 2 weeks, despite treatment.  Signs of infection develop, including fever of 102 F (38.9 C), increased pain, redness, warmth, or pus draining from the bursa.  New, unexplained  symptoms develop. (Drugs used in treatment may produce side effects.) EXERCISES  RANGE OF MOTION (ROM) AND STRETCHING EXERCISES - Olecranon Bursitis These exercises may help you when beginning to rehabilitate your injury. Your symptoms may resolve with or without further involvement from your physician, physical therapist or athletic trainer. While completing these exercises, remember:   Restoring tissue flexibility helps normal motion to return to the joints. This allows healthier, less painful movement and activity.  An effective stretch should be held for at least 30 seconds.  A stretch should never be painful. You should only feel a gentle lengthening or release in the stretched tissue. RANGE OF MOTION - Elbow Flexion, Supine  Lie on your back. Extend your right / left arm into the air, bracing it with your opposite hand. Allow your right / left arm to relax.  Let your elbow bend, allowing your hand to fall slowly toward your chest.  You should feel a gentle stretch along the back of your upper arm and elbow. Your physician, physical therapist or athletic trainer may ask you to hold a __________ hand weight to increase the intensity of this stretch.  Hold for __________ seconds. Slowly return your right / left arm to the upright position. Repeat __________ times. Complete this exercise __________ times per day. STRETCH - Elbow Flexors  Lie on a firm bed or countertop on your back. Be sure that you are in a comfortable position which will allow you to relax your arm muscles.  Place a folded towel under your right / left upper arm, so that your elbow and shoulder are at the same height. Extend your arm; your elbow should not rest on the bed or towel  Allow the weight of your hand to straighten your elbow. Keep your arm and chest muscles relaxed. Your caregiver may ask you to increase the intensity of your stretch by adding a small wrist or hand weight.  Hold for __________ seconds. You  should feel a stretch on the inside of your elbow. Slowly return to the starting position. Repeat __________ times. Complete this exercise __________ times per day. STRENGTHENING EXERCISES - Olecranon Bursitis These exercises will help you regain your strength. They may resolve your symptoms with or without further involvement from your physician, physical therapist or athletic trainer. While completing these exercises, remember:   Muscles can gain both the endurance and the strength needed for everyday activities through controlled exercises.  Complete these exercises as instructed by your physician, physical therapist or athletic trainer. Increase the resistance and repetitions only as guided by your caregiver.  You may experience muscle soreness or fatigue, but the pain or discomfort you are trying to eliminate should never worsen during these exercises. If this pain does worsen, stop and make certain you are following the directions exactly. If the pain is still present after adjustments, discontinue the exercise until you can discuss the trouble with your caregiver. STRENGTH - Elbow Extensors, Isometric  Stand or sit upright on a firm surface. Place your right / left arm so that your palm faces your stomach, and it is at the height of your waist.  Place your opposite hand on the underside of your forearm. Gently push up as your right / left arm resists. Push as hard as you can with both arms without causing any pain or movement at your right /  left elbow. Hold this stationary position for __________ seconds.  Gradually release the tension in both arms. Allow your muscles to relax completely before repeating. Repeat __________ times. Complete this exercise __________ times per day. STRENGTH - Elbow Flexors, Isometric  Stand or sit upright on a firm surface. Place your right / left arm so that your hand is palm-up and at the height of your waist.  Place your opposite hand on top of your  forearm. Gently push down as your right / left arm resists. Push as hard as you can with both arms without causing any pain or movement at your right / left elbow. Hold this stationary position for __________ seconds.  Gradually release the tension in both arms. Allow your muscles to relax completely before repeating. Repeat __________ times. Complete this exercise __________ times per day. STRENGTH - Elbow Flexors, Supinated  With good posture, stand or sit on a firm chair without armrests. Allow your right / left arm to rest at your side with your palm facing forward.  Holding a __________ weight, or gripping a rubber exercise band or tubing,  bring your hand toward your shoulder.  Allow your muscles to control the resistance as your hand returns to your side. Repeat __________ times. Complete this exercise __________ times per day.  STRENGTH - Elbow Flexors, Neutral  With good posture, stand or sit on a firm chair without armrests. Allow your right / left arm to rest at your side with your thumb facing forward.  Holding a __________weight, or gripping a rubber exercise band or tubing,  bring your hand toward your shoulder.  Allow your muscles to control the resistance as your hand returns to your side. Repeat __________ times. Complete this exercise __________ times per day.  STRENGTH - Elbow Extensors  Lie on your back. Extend your right / left elbow into the air, pointing it toward the ceiling. Brace your arm with your opposite hand.*  Holding a __________ weight in your hand, slowly straighten your right / left elbow.  Allow your muscles to control the weight as your hand returns to its starting position. Repeat __________ times. Complete this exercise __________ times per day. *You may also stand with your elbow overhead and pointed toward the ceiling, supported by your opposite hand. STRENGTH - Elbow Extensors, Dynamic  With good posture, stand, or sit on a firm chair  without armrests. Keeping your upper arms at your side, bring both hands up to your right / left shoulder while gripping a rubber exercise band or tubing. Your right / left hand should be just below the other hand.  Straighten your right / left elbow. Hold for __________ seconds.  Allow your muscles to control the rubber exercise band, as your hand returns to your shoulder. Repeat __________ times. Complete this exercise __________ times per day.   This information is not intended to replace advice given to you by your health care provider. Make sure you discuss any questions you have with your health care provider.   Document Released: 07/15/2005 Document Revised: 11/29/2014 Document Reviewed: 10/27/2008 Elsevier Interactive Patient Education [employer]

## 2016-01-02 NOTE — Progress Notes (Signed)
Dominic BlancoJonathan Patrick Howard is a 40 y.o. male who [facility] today for left olecranon bursitis. He was seen about 2 months ago for the same left elbow swelling thought to be traumatic olecranon bursitis. He had aspiration and injection and did wonderfully until about a week ago when his symptoms returned. He denies any obvious recent injury. He feels well otherwise with no fevers or chills. The area is not particularly tender or painful.   No past medical history on file. Past Surgical History  Procedure Laterality Date  . Wisdom tooth extraction     Social History  Substance Use Topics  . Smoking status: Former Smoker    Types: Cigarettes    Quit date: 11/26/2009  . Smokeless tobacco: Not on file  . Alcohol Use: 0.6 oz/week    1 Cans of beer per week   family history includes Bone cancer in his paternal uncle; Brain cancer in his paternal aunt; Heart attack in his maternal grandfather; Heart disease in his paternal grandfather; Ovarian cancer in his maternal aunt; Stroke in his maternal grandfather.  ROS:  No headache, visual changes, nausea, vomiting, diarrhea, constipation, dizziness, abdominal pain, skin rash, fevers, chills, night sweats, weight loss, swollen lymph nodes, body aches, joint swelling, muscle aches, chest pain, shortness of breath, mood changes, visual or auditory hallucinations.    Medications: Current Outpatient Prescriptions  Medication Sig Dispense Refill  . albuterol (VENTOLIN HFA) 108 (90 BASE) MCG/ACT inhaler Inhale into the lungs. 2-4 puffs inhaled bid prn sob     . Diclofenac Sodium 2 % SOLN Place 2 sprays onto the skin 2 (two) times daily. 1 Bottle 11  . fluticasone (FLONASE) 50 MCG/ACT nasal spray One spray in each nostril twice a day, use left hand for right nostril, and right hand for left nostril. 48 g 3  . methocarbamol (ROBAXIN) 500 MG tablet Take 1 tablet (500 mg total) by mouth every 8 (eight)  hours as needed (neck pain). 45 tablet 0  . naproxen (NAPROSYN) 500 MG tablet Take 1 tablet (500 mg total) by mouth 2 (two) times daily with a meal. 30 tablet 0  . venlafaxine XR (EFFEXOR XR) 75 MG 24 hr capsule One by mouth daily. 90 capsule 2   No current facility-administered medications for this visit.   No Known Allergies   Exam:  BP 124/74 mmHg  Pulse 80  Wt 202 lb (91.627 kg) General: Well Developed, well nourished, and in no acute distress.  Neuro/Psych: Alert and oriented x3, extra-ocular muscles intact, able to move all 4 extremities, sensation grossly intact. Skin: Warm and dry, no rashes noted.  Respiratory: Not using accessory muscles, speaking in full sentences, trachea midline.  Cardiovascular: Pulses palpable, no extremity edema. Abdomen: Does not appear distended. MSK: Left elbow olecranon swelling fluctuance nontender no erythema or induration Pulses capillary refill and sensation intact distally.  Aspiration and injection of left olecranon bursa Consent obtained and timeout performed. Reviewed potential side effects or adverse reactions including but not limiting to infection bleeding skin hypopigmentation fat atrophy steroid reaction. Skin cleaned with alcohol and cold spray applied and 1 mL of lidocaine injected superficially. 18-gauge needle used to access the bursa sac and 10 mL of cloudy bloody fluid removed. Syringe exchanged and 1 mL of Marcaine and 40 mg of Kenalog injected. Dressing applied and Ace wrap applied. Patient tolerated procedure well    No results found for this or any previous visit (from the past 24 hour(s)).  No results found.   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 Second aspiration and injection today. Fluid sent for culture. Recommend compressive elbow sleeve as this will likely prevent recurrence. If symptoms recur refer to orthopedics likely.

## 2016-01-07 LAB — BODY FLUID CULTURE
Gram Stain: NONE SEEN
Organism ID, Bacteria: NO GROWTH

## 2016-01-08 NOTE — Progress Notes (Signed)
Quick Note:  Nothing is growing from the elbow fluid. ______

## 2016-02-23 ENCOUNTER — Ambulatory Visit (INDEPENDENT_AMBULATORY_CARE_PROVIDER_SITE_OTHER): Payer: 59 | Admitting: Sports Medicine

## 2016-02-23 DIAGNOSIS — M7022 Olecranon bursitis, left elbow: Secondary | ICD-10-CM | POA: Diagnosis not present

## 2016-02-23 NOTE — Progress Notes (Signed)
   Subjective:    I'm seeing this patient as a consultation for:  Dr. Laren Boom  CC: Left elbow swelling  HPI: This is a pleasant 40 year old male, he has dealt with olecranon bursitis for some time now, he has had aspirations, injections, all previous cell count, crystal analyses, cultures have been negative. Unfortunately he is having a recurrence, and at this point he is agreeable to proceed with aspiration and injection but then referral for surgical excision. Symptoms are moderate, persistent. 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  Past medical history, Surgical history, Family history not pertinant except as noted below, Social history, Allergies, and medications have been entered into the medical record, reviewed, and no changes needed.   Review of Systems: No headache, visual changes, nausea, vomiting, diarrhea, constipation, dizziness, abdominal pain, skin rash, fevers, chills, night sweats, weight loss, swollen lymph nodes, body aches, joint swelling, muscle aches, chest pain, shortness of breath, mood changes, visual or auditory hallucinations.   Objective:   General: Well Developed, well nourished, and in no acute distress.  Neuro/Psych: Alert and oriented x3, extra-ocular muscles intact, able to move all 4 extremities, sensation grossly intact. Skin: Warm and dry, no rashes noted.  Respiratory: Not using accessory muscles, speaking in full sentences, trachea midline.  Cardiovascular: Pulses palpable, no extremity edema. Abdomen: Does not appear distended. Left Elbow: Visibly swollen, nontender olecranon bursa Range of motion full pronation, supination, flexion, extension. Strength is full to all of the above directions Stable to varus, valgus stress. Negative moving valgus stress test. No discrete areas of tenderness to palpation. Ulnar nerve does not sublux. Negative cubital tunnel Tinel's.  Procedure:  Real-time Ultrasound Guided aspiration/Injection of left olecranon bursa Device: GE Logiq E  Verbal informed consent obtained.  Time-out conducted.  Noted no overlying erythema, induration, or other signs of local infection.  Skin prepped in a sterile fashion.  Local anesthesia: Topical Ethyl chloride.  With sterile technique and under real time ultrasound guidance:  Aspirated approximately 10 mL of blood-tinged fluid, syringe switched and 1/2 mL Kenalog 40, 1/2 mL lidocaine injected easily. Completed without difficulty  Pain immediately resolved suggesting accurate placement of the medication.  Advised to call if fevers/chills, erythema, induration, drainage, or persistent bleeding.  Images permanently stored and available for review in the ultrasound unit.  Impression: Technically successful ultrasound guided injection.  Impression and Recommendations:   This case required medical decision making of moderate complexity.  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 Luiz Blare for olecranon bursa excision. Previous cultures, crystal analyses, cell counts have been unremarkable.

## 2016-02-23 NOTE — Assessment & Plan Note (Signed)
This is the third recurrence, aspiration and injection, referral to Dr. Luiz Blare for olecranon bursa excision. Previous cultures, crystal analyses, cell counts have been unremarkable.

## 2016-05-02 ENCOUNTER — Other Ambulatory Visit: Payer: Self-pay | Admitting: Family Medicine

## 2016-05-02 DIAGNOSIS — F419 Anxiety disorder, unspecified: Secondary | ICD-10-CM

## 2016-05-16 ENCOUNTER — Telehealth: Payer: Self-pay | Admitting: Family Medicine

## 2016-05-16 NOTE — Telephone Encounter (Signed)
Sounds great!

## 2016-05-16 NOTE — Telephone Encounter (Signed)
Patients Wife Angelica called and was wanting to make  her husband an appt with Dr.Corey since Dr.Hommel is no longer here. Her husband is pretty healthy and wants to keep a male pcp. Will Dr.Corey Accept this male patient switching from Hommel ? Call wife back and let her know at [phone] and they will schedule

## 2016-05-23 ENCOUNTER — Telehealth: Payer: Self-pay | Admitting: Family Medicine

## 2016-05-23 NOTE — Telephone Encounter (Signed)
Pt called. He wants to know if you would accept him as a patient.

## 2016-05-23 NOTE — Telephone Encounter (Signed)
Unfortunately I am no longer accepting new primary care patients

## 2016-05-27 ENCOUNTER — Ambulatory Visit (INDEPENDENT_AMBULATORY_CARE_PROVIDER_SITE_OTHER): Payer: 59 | Admitting: Family Medicine

## 2016-05-27 ENCOUNTER — Encounter: Payer: Self-pay | Admitting: Family Medicine

## 2016-05-27 VITALS — BP 130/84 | HR 65 | Wt 205.0 lb

## 2016-05-27 DIAGNOSIS — Z23 Encounter for immunization: Secondary | ICD-10-CM | POA: Diagnosis not present

## 2016-05-27 DIAGNOSIS — M7022 Olecranon bursitis, left elbow: Secondary | ICD-10-CM | POA: Diagnosis not present

## 2016-05-27 DIAGNOSIS — L719 Rosacea, unspecified: Secondary | ICD-10-CM

## 2016-05-27 DIAGNOSIS — S6992XA Unspecified injury of left wrist, hand and finger(s), initial encounter: Secondary | ICD-10-CM

## 2016-05-27 HISTORY — DX: Rosacea, unspecified: L71.9

## 2016-05-27 MED ORDER — METRONIDAZOLE 1 % EX GEL: Freq: Every day | CUTANEOUS | 12 refills | Status: DC

## 2016-05-27 NOTE — Patient Instructions (Signed)
Thank you for coming in today. Return as needed.  Let me know if the swelling comes back.  If it does I recommend seeing a Careers adviser.   Call or go to the ER if you develop a large red swollen joint with extreme pain or oozing puss.    Rosacea Rosacea is a long-term (chronic) condition that affects the skin of the face, including the cheeks, nose, brow, and chin. This condition can also affect the eyes. Rosacea causes blood vessels near the surface of the skin to enlarge, which results in redness. CAUSES The cause of this condition is not known. Certain triggers can make rosacea worse, including:  Hot baths.  Exercise.  Sunlight.  Very hot or cold temperatures.  Hot or spicy foods and drinks.  Drinking alcohol.  Stress.  Taking blood pressure medicine.  Long-term use of topical steroids on the face. RISK FACTORS This condition is more likely to develop in:  People who are older than 39 years of age.  Women.  People who have light-colored skin (light complexion).  People who have a family history of rosacea. SYMPTOMS  Symptoms of this condition include:  Redness of the face.  Red bumps or pimples on the face.  A red, enlarged nose.  Blushing easily.  Red lines on the skin.  Irritated or burning feeling in the eyes.  Swollen eyelids. DIAGNOSIS This condition is diagnosed with a medical history and physical exam. TREATMENT There is no cure for this condition, but treatment can help to control your symptoms. Your health care provider may recommend that you see a skin specialist (dermatologist). Treatment may include:  Antibiotic medicines that are applied to the skin or taken as a pill.  Laser treatment to improve the appearance of the skin.  Surgery. This is rare. Your health care provider will also recommend the best way to take care of your skin. Even after your skin improves, you will likely need to continue treatment to prevent your rosacea from coming  back. HOME CARE INSTRUCTIONS Skin Care Take care of your skin as told by your health care provider. You may be told to do these things:  Wash your skin gently two or more times each day.  Use mild soap.  Use a sunscreen or sunblock with SPF 30 or greater.  Use gentle cosmetics that are meant for sensitive skin.  Shave with an electric shaver instead of a blade. Lifestyle  Try to keep track of what foods trigger this condition. Avoid any triggers. These may include:  Spicy foods.  Seafood.  Cheese.  Hot liquids.  Nuts.  Chocolate.  Iodized salt.  Do not drink alcohol.  Avoid extremely cold or hot temperatures.  Try to reduce your stress. If you need help, talk with your health care provider.  When you exercise, do these things to stay cool:  Limit your sun exposure.  Use a fan.  Do shorter and more frequent intervals of exercise. General Instructions   Keep all follow-up visits as told by your health care provider. This is important.  Take over-the-counter and prescription medicines only as told by your health care provider.  If your eyelids are affected, apply warm compresses to them. Do this as told by your health care provider.  If you were prescribed an antibiotic medicine, apply or take it as told by your health care provider. Do not stop using the antibiotic even if your condition improves. SEEK MEDICAL CARE IF:  Your symptoms get worse.  Your symptoms  do not improve after two months of treatment.  You have new symptoms.  You have any changes in vision or you have problems with your eyes, such as redness or itching.  You feel depressed.  You lose your appetite.  You have trouble concentrating.   This information is not intended to replace advice given to you by your health care provider. Make sure you discuss any questions you have with your health care provider.   Document Released: 08/22/2004 Document Revised: 04/05/2015 Document  Reviewed: 09/21/2014 Elsevier Interactive Patient Education 2016 [employer]    Olecranon Bursitis With Rehab A bursa is a fluid-filled sac that is located between soft tissues (ligaments, tendons, skin) and bones. The purpose of a bursa is to allow the soft tissue to function smoothly, without friction. The olecranon bursa is located between the back of the elbow (olecranon) and the skin. Olecranon bursitis involves inflammation of this bursa, resulting in pain. SYMPTOMS   Pain, tenderness, swelling, warmth, or redness over the back of the elbow.  Reduced range of motion of the affected elbow.  Sometimes, severe pain with movement of the affected elbow.  Crackling sound (crepitation) when the bursa is moved or touched.  Often, painless swelling of the bursa.  Fever (when infected). CAUSES  Olecranon bursitis is often caused by direct hit (trauma) to the elbow. Less commonly, it is due to overuse and/or strenuous exercise that the elbow is not used to. RISK INCREASES WITH:  Sports that require bending or landing on the elbow (football, volleyball).  Vigorous or repetitive athletic training, or sudden increase or change in activity level (weekend warriors).  Failure to warm up properly before activity.  Poor exercise technique.  Playing on artificial turf. PREVENTION  Avoid injuries and the overuse of muscles whenever possible.  Warm up and stretch properly before activity.  Allow for adequate recovery between workouts.  Maintain physical fitness:  Strength, flexibility, and endurance.  Cardiovascular fitness.  Learn and use proper technique.  Wear properly fitted and padded protective equipment. PROGNOSIS  If treated properly, olecranon bursitis is usually curable within 2 weeks.  RELATED COMPLICATIONS   Longer healing time, if not properly treated or if not given enough time to heal.  Recurring symptoms that result in a chronic problem.  Joint stiffness  with permanent limitation of the affected joint's movement.  Infection of the bursa.  Chronic inflammation or scarring of the bursa. TREATMENT Treatment first involves the use of ice and medicine to reduce pain and inflammation. The use of strengthening and stretching exercises may help reduce pain with activity. These exercises may be performed at home or with a therapist. Elbow pads may be advised to protect the bursa. If symptoms persist despite nonsurgical treatment, a procedure to withdraw fluid from the bursa may be advised. This procedure may be accompanied with an injection of corticosteroids to reduce inflammation. Sometimes, surgery is needed to remove the bursa. MEDICATION  If pain medicine is needed, nonsteroidal anti-inflammatory medicines (aspirin and ibuprofen), or other minor pain relievers (acetaminophen), are often advised.  Do not take pain medicine for 7 days before surgery.  Prescription pain relievers may be given, if your caregiver thinks they are needed. Use only as directed and only as much as you need.  Corticosteroid injections may be given by your caregiver. These injections should be reserved for the most serious cases, because they may only be given a certain number of times. HEAT AND COLD  Cold treatment (icing) should be applied for  10 to 15 minutes every 2 to 3 hours for inflammation and pain, and immediately after activity that aggravates your symptoms. Use ice packs or an ice massage.  Heat treatment may be used before performing stretching and strengthening activities prescribed by your caregiver, physical therapist, or athletic trainer. Use a heat pack or a warm water soak. SEEK IMMEDIATE MEDICAL CARE IF:   Symptoms get worse or do not improve in 2 weeks, despite treatment.  Signs of infection develop, including fever of 102 F (38.9 C), increased pain, redness, warmth, or pus draining from the bursa.  New, unexplained symptoms develop. (Drugs used in  treatment may produce side effects.) EXERCISES  RANGE OF MOTION (ROM) AND STRETCHING EXERCISES - Olecranon Bursitis These exercises may help you when beginning to rehabilitate your injury. Your symptoms may resolve with or without further involvement from your physician, physical therapist or athletic trainer. While completing these exercises, remember:   Restoring tissue flexibility helps normal motion to return to the joints. This allows healthier, less painful movement and activity.  An effective stretch should be held for at least 30 seconds.  A stretch should never be painful. You should only feel a gentle lengthening or release in the stretched tissue. RANGE OF MOTION - Elbow Flexion, Supine  Lie on your back. Extend your right / left arm into the air, bracing it with your opposite hand. Allow your right / left arm to relax.  Let your elbow bend, allowing your hand to fall slowly toward your chest.  You should feel a gentle stretch along the back of your upper arm and elbow. Your physician, physical therapist or athletic trainer may ask you to hold a __________ hand weight to increase the intensity of this stretch.  Hold for __________ seconds. Slowly return your right / left arm to the upright position. Repeat __________ times. Complete this exercise __________ times per day. STRETCH - Elbow Flexors  Lie on a firm bed or countertop on your back. Be sure that you are in a comfortable position which will allow you to relax your arm muscles.  Place a folded towel under your right / left upper arm, so that your elbow and shoulder are at the same height. Extend your arm; your elbow should not rest on the bed or towel  Allow the weight of your hand to straighten your elbow. Keep your arm and chest muscles relaxed. Your caregiver may ask you to increase the intensity of your stretch by adding a small wrist or hand weight.  Hold for __________ seconds. You should feel a stretch on the  inside of your elbow. Slowly return to the starting position. Repeat __________ times. Complete this exercise __________ times per day. STRENGTHENING EXERCISES - Olecranon Bursitis These exercises will help you regain your strength. They may resolve your symptoms with or without further involvement from your physician, physical therapist or athletic trainer. While completing these exercises, remember:   Muscles can gain both the endurance and the strength needed for everyday activities through controlled exercises.  Complete these exercises as instructed by your physician, physical therapist or athletic trainer. Increase the resistance and repetitions only as guided by your caregiver.  You may experience muscle soreness or fatigue, but the pain or discomfort you are trying to eliminate should never worsen during these exercises. If this pain does worsen, stop and make certain you are following the directions exactly. If the pain is still present after adjustments, discontinue the exercise until you can discuss the trouble  with your caregiver. STRENGTH - Elbow Extensors, Isometric  Stand or sit upright on a firm surface. Place your right / left arm so that your palm faces your stomach, and it is at the height of your waist.  Place your opposite hand on the underside of your forearm. Gently push up as your right / left arm resists. Push as hard as you can with both arms without causing any pain or movement at your right / left elbow. Hold this stationary position for __________ seconds.  Gradually release the tension in both arms. Allow your muscles to relax completely before repeating. Repeat __________ times. Complete this exercise __________ times per day. STRENGTH - Elbow Flexors, Isometric  Stand or sit upright on a firm surface. Place your right / left arm so that your hand is palm-up and at the height of your waist.  Place your opposite hand on top of your forearm. Gently push down as your  right / left arm resists. Push as hard as you can with both arms without causing any pain or movement at your right / left elbow. Hold this stationary position for __________ seconds.  Gradually release the tension in both arms. Allow your muscles to relax completely before repeating. Repeat __________ times. Complete this exercise __________ times per day. STRENGTH - Elbow Flexors, Supinated  With good posture, stand or sit on a firm chair without armrests. Allow your right / left arm to rest at your side with your palm facing forward.  Holding a __________ weight, or gripping a rubber exercise band or tubing,  bring your hand toward your shoulder.  Allow your muscles to control the resistance as your hand returns to your side. Repeat __________ times. Complete this exercise __________ times per day.  STRENGTH - Elbow Flexors, Neutral  With good posture, stand or sit on a firm chair without armrests. Allow your right / left arm to rest at your side with your thumb facing forward.  Holding a __________weight, or gripping a rubber exercise band or tubing,  bring your hand toward your shoulder.  Allow your muscles to control the resistance as your hand returns to your side. Repeat __________ times. Complete this exercise __________ times per day.  STRENGTH - Elbow Extensors  Lie on your back. Extend your right / left elbow into the air, pointing it toward the ceiling. Brace your arm with your opposite hand.*  Holding a __________ weight in your hand, slowly straighten your right / left elbow.  Allow your muscles to control the weight as your hand returns to its starting position. Repeat __________ times. Complete this exercise __________ times per day. *You may also stand with your elbow overhead and pointed toward the ceiling, supported by your opposite hand. STRENGTH - Elbow Extensors, Dynamic  With good posture, stand, or sit on a firm chair without armrests. Keeping your upper  arms at your side, bring both hands up to your right / left shoulder while gripping a rubber exercise band or tubing. Your right / left hand should be just below the other hand.  Straighten your right / left elbow. Hold for __________ seconds.  Allow your muscles to control the rubber exercise band, as your hand returns to your shoulder. Repeat __________ times. Complete this exercise __________ times per day.   This information is not intended to replace advice given to you by your health care provider. Make sure you discuss any questions you have with your health care provider.   Document Released: 07/15/2005  Document Revised: 11/29/2014 Document Reviewed: 10/27/2008 Elsevier Interactive Patient Education [employer]

## 2016-05-27 NOTE — Addendum Note (Signed)
Addended by: Minna AntisBRIGHAM, EBONY T on: 05/27/2016 11:32 AM   Modules accepted: Orders

## 2016-05-27 NOTE — Progress Notes (Signed)
Dominic Howard is a 40 y.o. male [facility] today for follow-up left elbow olecranon bursitis and face rash.  Left elbow olecranon bursitis: Patient has had recurrent episodes of olecranon bursitis over the last year or so. He most recently had the olecranon bursa aspirated and injected in July. He did well until recently when it returned when he bumped his elbow again. He notes it's not painful with no warmth or tenderness or discharge of pus. He denies any fevers or chills.  He does note however last week he fell while at the baseline and undergoes outstretched left wrist. He had some forearm pain and was seen in urgent care x-rays were negative. He's feeling much better now. He denies any pain in the anatomical snuff box or at the proximal radial head. He denies significant pain with wrist or elbow motion.  Rash: Patient notes a red rash on his face present off and on for years. Typically is worse in the winter. He notes that sometimes stings and burns.   No past medical history on file. Past Surgical History:  Procedure Laterality Date  . WISDOM TOOTH EXTRACTION     Social History  Substance Use Topics  . Smoking status: Former Smoker    Types: Cigarettes    Quit date: 11/26/2009  . Smokeless tobacco: Not on file  . Alcohol use 0.6 oz/week    1 Cans of beer per week   family history includes Bone cancer in his paternal uncle; Brain cancer in his paternal aunt; Heart attack in his maternal grandfather; Heart disease in his paternal grandfather; Ovarian cancer in his maternal aunt; Stroke in his maternal grandfather.  ROS as above:  Medications: Current Outpatient Prescriptions  Medication Sig Dispense Refill  . albuterol (VENTOLIN HFA) 108 (90 BASE) MCG/ACT inhaler Inhale into the lungs. 2-4 puffs inhaled bid prn sob     . Diclofenac  Sodium 2 % SOLN Place 2 sprays onto the skin 2 (two) times daily. 1 Bottle 11  . fluticasone (FLONASE) 50 MCG/ACT nasal spray One spray in each nostril twice a day, use left hand for right nostril, and right hand for left nostril. 48 g 3  . methocarbamol (ROBAXIN) 500 MG tablet Take 1 tablet (500 mg total) by mouth every 8 (eight) hours as needed (neck pain). 45 tablet 0  . metroNIDAZOLE (METROGEL) 1 % gel Apply topically daily. 45 g 12  . naproxen (NAPROSYN) 500 MG tablet Take 1 tablet (500 mg total) by mouth 2 (two) times daily with a meal. 30 tablet 0  . venlafaxine XR (EFFEXOR-XR) 75 MG 24 hr capsule Take 1 capsule (75 mg total) by mouth daily. NEED FOLLOW UP APPOINTMENT FOR MORE REFILLS 30 capsule 0   No current facility-administered medications for this visit.    No Known Allergies  Health Maintenance Health Maintenance  Topic Date Due  . TETANUS/TDAP  07/29/2014  . INFLUENZA VACCINE  02/27/2016  . HIV Screening  Completed     Exam:  BP 130/84   Pulse 65   Wt 205 lb (93 kg)   BMI 29.41 kg/m  Gen: Well NAD HEENT: EOMI,  MMM  Lungs: Normal work of breathing. CTABL Heart: RRR no MRG Abd: NABS, Soft. Nondistended, Nontender Exts: Brisk capillary refill, warm and well perfused.  MSK: Wrist and elbow are nontender. Patient has a large fluctuant nontender non-erythematous mass at the olecranon. Skin: Macular erythematous rash on  the malar area with some involvement in the eyebrows with scaling.  Left Olecranon bursa aspiration and drainage: Consent obtained and timeout performed. Skin cleaned with alcohol and ethylene chloride spray used. 1 mL of lidocaine without epinephrine injected subcutaneously achieving good anesthesia. Gauge needle was used to access the bursa sac and 15ml of serous fluid removed. The syringe was exchanged and 80 mg of Kenalog and 1 mL of Marcaine was injected. Patient tolerated the procedure well.  Lot numbers: Lidocaine: [id number] Marcaine  [id number] Kenalog: AAP [id number]   No results found for this or any previous visit (from the past 72 hour(s)). No results found.    Assessment and Plan: 40 y.o. male with  Left elbow olecranon bursitis: Recurrent. Aspirated injected today. Discussed with patient that if this returns would strongly recommend proceeding with surgery.  Left elbow wrist injury from fall 1 week ago: Doubtful for any serious injury. Patient is nontender in the scaphoid or radial head area. He is improving significantly. Continue to follow.  Rosacea: Treat with metronidazole gel.   No orders of the defined types were placed in this encounter.   Discussed warning signs or symptoms. Please see discharge instructions. Patient expresses understanding.

## 2016-05-29 ENCOUNTER — Ambulatory Visit (INDEPENDENT_AMBULATORY_CARE_PROVIDER_SITE_OTHER): Payer: 59 | Admitting: Family Medicine

## 2016-05-29 ENCOUNTER — Encounter: Payer: Self-pay | Admitting: Family Medicine

## 2016-05-29 VITALS — BP 129/73 | HR 57 | Wt 206.0 lb

## 2016-05-29 DIAGNOSIS — Z0000 Encounter for general adult medical examination without abnormal findings: Secondary | ICD-10-CM | POA: Diagnosis not present

## 2016-05-29 DIAGNOSIS — L719 Rosacea, unspecified: Secondary | ICD-10-CM | POA: Diagnosis not present

## 2016-05-29 DIAGNOSIS — F419 Anxiety disorder, unspecified: Secondary | ICD-10-CM | POA: Diagnosis not present

## 2016-05-29 DIAGNOSIS — F411 Generalized anxiety disorder: Secondary | ICD-10-CM | POA: Insufficient documentation

## 2016-05-29 HISTORY — DX: Generalized anxiety disorder: F41.1

## 2016-05-29 MED ORDER — VENLAFAXINE HCL ER 75 MG PO CP24: 75.0000 mg | ORAL_CAPSULE | Freq: Every day | ORAL | 3 refills | Status: DC

## 2016-05-29 NOTE — Progress Notes (Signed)
Marcille BlancoJonathan Patrick Barsch is a 40 y.o. male who [facility] today for well adult visit. Patient presents to clinic with no active medical problems. He feels well. He has a history of anxiety and rosacea as well as olecranon bursitis of the left elbow. His anxiety is very well controlled with Effexor. He would like to continue it. He has not yet started using metronidazole for his rosacea. He feels well with no chest pain palpitations shortness of breath. Otherwise the review systems is negative. He tries to exercise regularly and eat a healthy diet. He does not smoke or drink excessively.   Past Medical History:  Diagnosis Date  . GAD (generalized anxiety disorder) 05/29/2016  . Olecranon bursitis of left elbow 10/09/2015  . Rosacea 05/27/2016   Past Surgical History:  Procedure Laterality Date  . WISDOM TOOTH EXTRACTION     Social History  Substance Use Topics  . Smoking status: Former Smoker    Types: Cigarettes    Quit date: 11/26/2009  . Smokeless tobacco: Not on file  . Alcohol use 0.6 oz/week    1 Cans of beer per week   family history includes Bone cancer in his paternal uncle; Brain cancer in his paternal aunt; Heart attack in his maternal grandfather; Heart disease in his paternal grandfather; Ovarian cancer in his maternal aunt; Stroke in his maternal grandfather.  ROS as above:  Medications: Current Outpatient Prescriptions  Medication Sig Dispense Refill  . albuterol (VENTOLIN HFA) 108 (90 BASE) MCG/ACT inhaler Inhale into the lungs. 2-4 puffs inhaled bid prn sob     . venlafaxine XR (EFFEXOR-XR) 75 MG 24 hr capsule Take 1 capsule (75 mg total) by mouth daily. 90 capsule 3   No current facility-administered medications for this visit.    No Known Allergies  Health Maintenance Health Maintenance  Topic Date Due  . TETANUS/TDAP   05/27/2026  . INFLUENZA VACCINE  Completed  . HIV Screening  Completed     Exam:  BP 129/73   Pulse (!) 57   Wt 206 lb (93.4 kg)   BMI 29.56 kg/m  Gen: Well NAD HEENT: EOMI,  MMM Lungs: Normal work of breathing. CTABL Heart: RRR no MRG Abd: NABS, Soft. Nondistended, Nontender Exts: Brisk capillary refill, warm and well perfused.  Left elbow: Slight prominence olecranon bursa. Skin: Slight macular rash along the malar aspect with no tenderness. Psych: Alert and oriented normal speech and thought process and affect.  Depression screen PHQ 2/9 05/29/2016  Decreased Interest 0  Down, Depressed, Hopeless 1  PHQ - 2 Score 1  Altered sleeping 0  Tired, decreased energy 0  Change in appetite 0  Feeling bad or failure about yourself  1  Trouble concentrating 0  Moving slowly or fidgety/restless 0  Suicidal thoughts 0  PHQ-9 Score 2  Difficult doing work/chores Not difficult at all   GAD 7 : Generalized Anxiety Score 05/29/2016  Nervous, Anxious, on Edge 1  Control/stop worrying 0  Worry too much - different things 1  Trouble relaxing 1  Restless 0  Easily annoyed or irritable 1  Afraid - awful might happen 1  Total GAD 7 Score 5  Anxiety Difficulty Somewhat difficult      No results found for this or any previous visit (from the past 72 hour(s)). No results found.    Assessment and Plan: 40 y.o. male with  Well adult. Doing quite well. Continue current  treatment. Obtain fasting labs listed below. Otherwise recheck in 1 year or sooner if needed.   Orders Placed This Encounter  Procedures  . CBC  . COMPLETE METABOLIC PANEL WITH GFR  . Hemoglobin A1c  . Lipid panel  . TSH  . VITAMIN D 25 Hydroxy (Vit-D Deficiency, Fractures)    Discussed warning signs or symptoms. Please see discharge instructions. Patient expresses understanding.

## 2016-05-29 NOTE — Patient Instructions (Signed)
Thank you for coming in today. Get fasting labs at some point in the future.  Continue Effexor.  Return sooner if needed.  Otherwise I will see you in 1 year.

## 2016-10-30 ENCOUNTER — Ambulatory Visit (INDEPENDENT_AMBULATORY_CARE_PROVIDER_SITE_OTHER): Payer: 59

## 2016-10-30 ENCOUNTER — Ambulatory Visit (INDEPENDENT_AMBULATORY_CARE_PROVIDER_SITE_OTHER): Payer: 59 | Admitting: Family Medicine

## 2016-10-30 DIAGNOSIS — R05 Cough: Secondary | ICD-10-CM | POA: Diagnosis not present

## 2016-10-30 DIAGNOSIS — R053 Chronic cough: Secondary | ICD-10-CM

## 2016-10-30 MED ORDER — ALBUTEROL SULFATE HFA 108 (90 BASE) MCG/ACT IN AERS: 1.0000 | INHALATION_SPRAY | Freq: Four times a day (QID) | RESPIRATORY_TRACT | 6 refills | Status: AC | PRN

## 2016-10-30 MED ORDER — OMEPRAZOLE 40 MG PO CPDR: 40.0000 mg | DELAYED_RELEASE_CAPSULE | Freq: Every day | ORAL | 3 refills | Status: AC

## 2016-10-30 MED ORDER — FLUTICASONE PROPIONATE 50 MCG/ACT NA SUSP: 2.0000 | Freq: Every day | NASAL | 2 refills | Status: AC

## 2016-10-30 NOTE — Patient Instructions (Signed)
Thank you for coming in today. Get the xray today.  Get fasting labs soon.   Take omeprazole daily for acid reflux perhaps causing chronic cough.  Try for a month and see if it helps.   Use albuterol as needed. For cough or for shortness of breath.   Continue Flonase spray for allergies causing post nasal drainage.  Also take over the counter zyrtec or allegra or claritin.   Return in 1-3 months if not better.   Otherwise yearly checks in November.    Cough, Adult Coughing is a reflex that clears your throat and your airways. Coughing helps to heal and protect your lungs. It is normal to cough occasionally, but a cough that happens with other symptoms or lasts a long time may be a sign of a condition that needs treatment. A cough may last only 2-3 weeks (acute), or it may last longer than 8 weeks (chronic). What are the causes? Coughing is commonly caused by:  Breathing in substances that irritate your lungs.  A viral or bacterial respiratory infection.  Allergies.  Asthma.  Postnasal drip.  Smoking.  Acid backing up from the stomach into the esophagus (gastroesophageal reflux).  Certain medicines.  Chronic lung problems, including COPD (or rarely, lung cancer).  Other medical conditions such as heart failure. Follow these instructions at home: Pay attention to any changes in your symptoms. Take these actions to help with your discomfort:  Take medicines only as told by your health care provider.  If you were prescribed an antibiotic medicine, take it as told by your health care provider. Do not stop taking the antibiotic even if you start to feel better.  Talk with your health care provider before you take a cough suppressant medicine.  Drink enough fluid to keep your urine clear or pale yellow.  If the air is dry, use a cold steam vaporizer or humidifier in your bedroom or your home to help loosen secretions.  Avoid anything that causes you to cough at work or  at home.  If your cough is worse at night, try sleeping in a semi-upright position.  Avoid cigarette smoke. If you smoke, quit smoking. If you need help quitting, ask your health care provider.  Avoid caffeine.  Avoid alcohol.  Rest as needed. Contact a health care provider if:  You have new symptoms.  You cough up pus.  Your cough does not get better after 2-3 weeks, or your cough gets worse.  You cannot control your cough with suppressant medicines and you are losing sleep.  You develop pain that is getting worse or pain that is not controlled with pain medicines.  You have a fever.  You have unexplained weight loss.  You have night sweats. Get help right away if:  You cough up blood.  You have difficulty breathing.  Your heartbeat is very fast. This information is not intended to replace advice given to you by your health care provider. Make sure you discuss any questions you have with your health care provider. Document Released: 01/11/2011 Document Revised: 12/21/2015 Document Reviewed: 09/21/2014 Elsevier Interactive Patient Education  2017 [employer].

## 2016-10-30 NOTE — Progress Notes (Signed)
Dominic Howard is a 41 y.o. male who presents to [facility] today for chronic cough. Patient notes a several month history of chronic mild nonproductive cough. It occurs multiple times during the day but not with any one particular activity. He denies wheezing or shortness of breath. He uses Flonase nasal spray for seasonal allergies which helps some. In the past he said albuterol inhaler but has run out. He denies current wheezing. He denies acid reflux as well. He feels well otherwise.   Past Medical History:  Diagnosis Date  . GAD (generalized anxiety disorder) 05/29/2016  . Olecranon bursitis of left elbow 10/09/2015  . Rosacea 05/27/2016   Past Surgical History:  Procedure Laterality Date  . WISDOM TOOTH EXTRACTION     Social History  Substance Use Topics  . Smoking status: Former Smoker    Types: Cigarettes    Quit date: 11/26/2009  . Smokeless tobacco: Not on file  . Alcohol use 0.6 oz/week    1 Cans of beer per week   family history includes Bone cancer in his paternal uncle; Brain cancer in his paternal aunt; Heart attack in his maternal grandfather; Heart disease in his paternal grandfather; Ovarian cancer in his maternal aunt; Stroke in his maternal grandfather.  ROS as above:  Medications: Current Outpatient Prescriptions  Medication Sig Dispense Refill  . albuterol (VENTOLIN HFA) 108 (90 Base) MCG/ACT inhaler Inhale 1-2 puffs into the lungs every 6 (six) hours as needed for wheezing or shortness of breath. 2-4 puffs inhaled bid prn sob 18 g 6  . fluticasone (FLONASE) 50 MCG/ACT nasal spray Place 2 sprays into both nostrils daily. 16 g 2  . omeprazole (PRILOSEC) 40 MG capsule Take 1 capsule (40 mg total) by mouth daily. 90 capsule 3   No current facility-administered medications for this visit.    No Known Allergies  Health  Maintenance Health Maintenance  Topic Date Due  . INFLUENZA VACCINE  02/26/2017  . TETANUS/TDAP  05/27/2026  . HIV Screening  Completed     Exam:  BP 121/68   Pulse 68   Temp 98.6 F (37 C) (Oral)   Wt 206 lb (93.4 kg)   SpO2 99%   BMI 29.56 kg/m  Gen: Well NAD HEENT: EOMI,  MMM Normal posterior pharynx. Lungs: Normal work of breathing. CTABL Heart: RRR no MRG Abd: NABS, Soft. Nondistended, Nontender Exts: Brisk capillary refill, warm and well perfused.    No results found for this or any previous visit (from the past 72 hour(s)). Dg Chest 2 View  Result Date: 10/30/2016 CLINICAL DATA:  Chronic cough, congestion, history of asthma EXAM: CHEST  2 VIEW COMPARISON:  None. FINDINGS: Cardiomediastinal silhouette is unremarkable. No infiltrate or pleural effusion. No pulmonary edema. Bony thorax is unremarkable. IMPRESSION: No active cardiopulmonary disease. Electronically Signed   By: Natasha Mead M.D.   On: 10/30/2016 15:54      Assessment and Plan: 41 y.o. male with chronic cough. Unclear etiology. Plan to treat with Flonase for postnasal drainage, omeprazole for potential silent acid reflex, and albuterol for cough variant asthma. Chest x-ray pending. Recheck if not better.   Orders Placed This Encounter  Procedures  . DG Chest 2 View    Order Specific Question:   Reason for exam:    Answer:   Cough, assess intra-thoracic pathology    Order Specific Question:   Preferred imaging location?    Answer:   Therapist, music  [place]   Meds ordered this encounter  Medications  . albuterol (VENTOLIN HFA) 108 (90 Base) MCG/ACT inhaler    Sig: Inhale 1-2 puffs into the lungs every 6 (six) hours as needed for wheezing or shortness of breath. 2-4 puffs inhaled bid prn sob    Dispense:  18 g    Refill:  6  . omeprazole (PRILOSEC) 40 MG capsule    Sig: Take 1 capsule (40 mg total) by mouth daily.    Dispense:  90 capsule    Refill:  3  . fluticasone (FLONASE) 50 MCG/ACT nasal spray     Sig: Place 2 sprays into both nostrils daily.    Dispense:  16 g    Refill:  2     Discussed warning signs or symptoms. Please see discharge instructions. Patient expresses understanding.

## 2016-12-09 LAB — CBC
HEMATOCRIT: 43.8 % (ref 38.5–50.0)
Hemoglobin: 15 g/dL (ref 13.2–17.1)
MCH: 30.5 pg (ref 27.0–33.0)
MCHC: 34.2 g/dL (ref 32.0–36.0)
MCV: 89.2 fL (ref 80.0–100.0)
MPV: 11.2 fL (ref 7.5–12.5)
Platelets: 188 10*3/uL (ref 140–400)
RBC: 4.91 MIL/uL (ref 4.20–5.80)
RDW: 13.9 % (ref 11.0–15.0)
WBC: 6.5 10*3/uL (ref 3.8–10.8)

## 2016-12-10 LAB — COMPLETE METABOLIC PANEL WITH GFR
ALK PHOS: 42 U/L (ref 40–115)
ALT: 24 U/L (ref 9–46)
AST: 22 U/L (ref 10–40)
Albumin: 4.4 g/dL (ref 3.6–5.1)
BILIRUBIN TOTAL: 0.3 mg/dL (ref 0.2–1.2)
BUN: 13 mg/dL (ref 7–25)
CALCIUM: 9.3 mg/dL (ref 8.6–10.3)
CO2: 25 mmol/L (ref 20–31)
CREATININE: 1.17 mg/dL (ref 0.60–1.35)
Chloride: 106 mmol/L (ref 98–110)
GFR, EST NON AFRICAN AMERICAN: 78 mL/min (ref >=60)
Glucose, Bld: 104 mg/dL — ABNORMAL HIGH (ref 65–99)
Potassium: 4 mmol/L (ref 3.5–5.3)
Sodium: 141 mmol/L (ref 135–146)
TOTAL PROTEIN: 6.8 g/dL (ref 6.1–8.1)

## 2016-12-10 LAB — HEMOGLOBIN A1C
HEMOGLOBIN A1C: 5.3 % (ref <5.7)
Mean Plasma Glucose: 105 mg/dL

## 2016-12-10 LAB — VITAMIN D 25 HYDROXY (VIT D DEFICIENCY, FRACTURES): VIT D 25 HYDROXY: 32 ng/mL (ref 30–100)

## 2016-12-10 LAB — LIPID PANEL
CHOLESTEROL: 156 mg/dL (ref <200)
HDL: 47 mg/dL (ref >40)
LDL CALC: 94 mg/dL (ref <100)
Total CHOL/HDL Ratio: 3.3 Ratio (ref <5.0)
Triglycerides: 75 mg/dL (ref <150)
VLDL: 15 mg/dL (ref <30)

## 2016-12-10 LAB — TSH: TSH: 1.55 mIU/L (ref 0.40–4.50)

## 2018-04-16 IMAGING — DX DG CHEST 2V
2 series · 2 of 2 positions shown · non-contrast
Comparison: None.

CLINICAL DATA: Chronic cough, congestion, history of asthma

EXAM:
CHEST  2 VIEW

[chest pa]
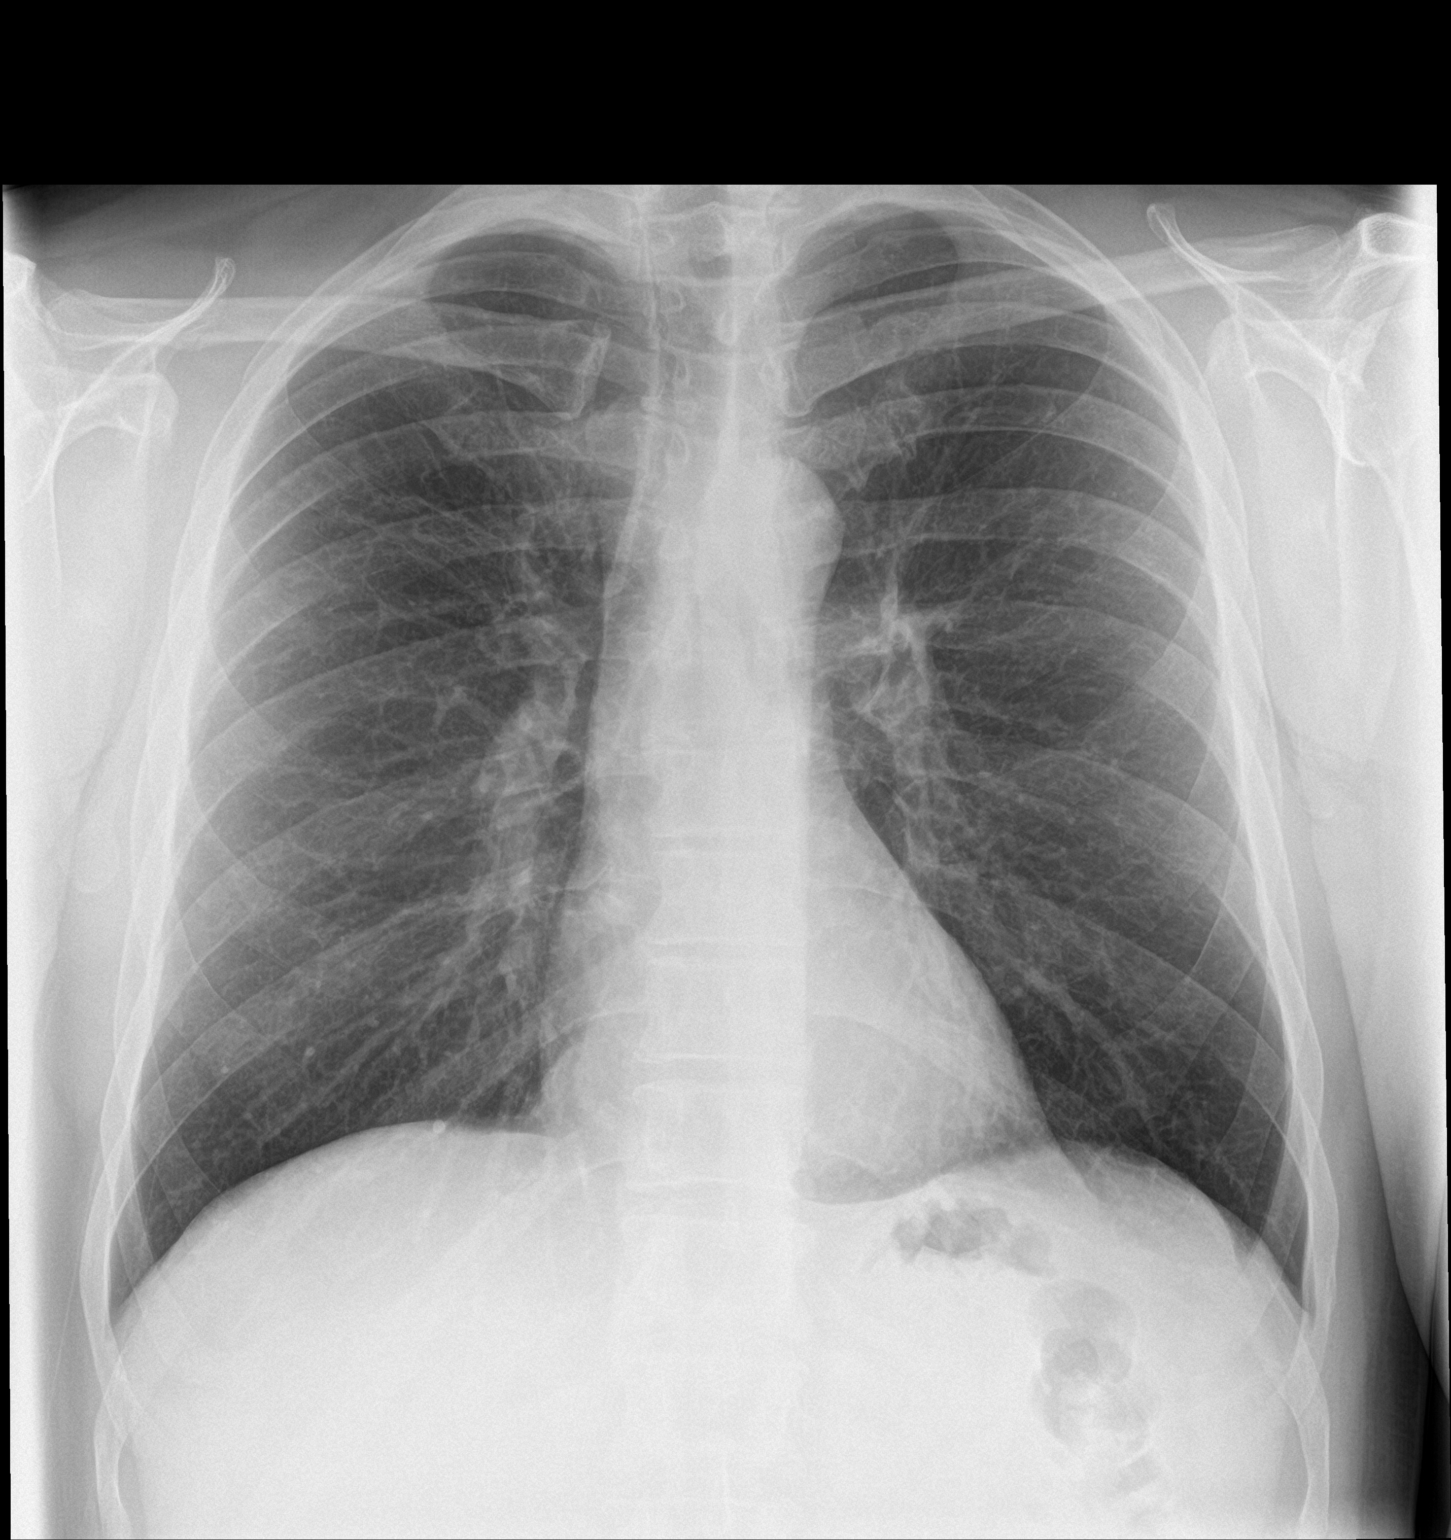

[chest lat]
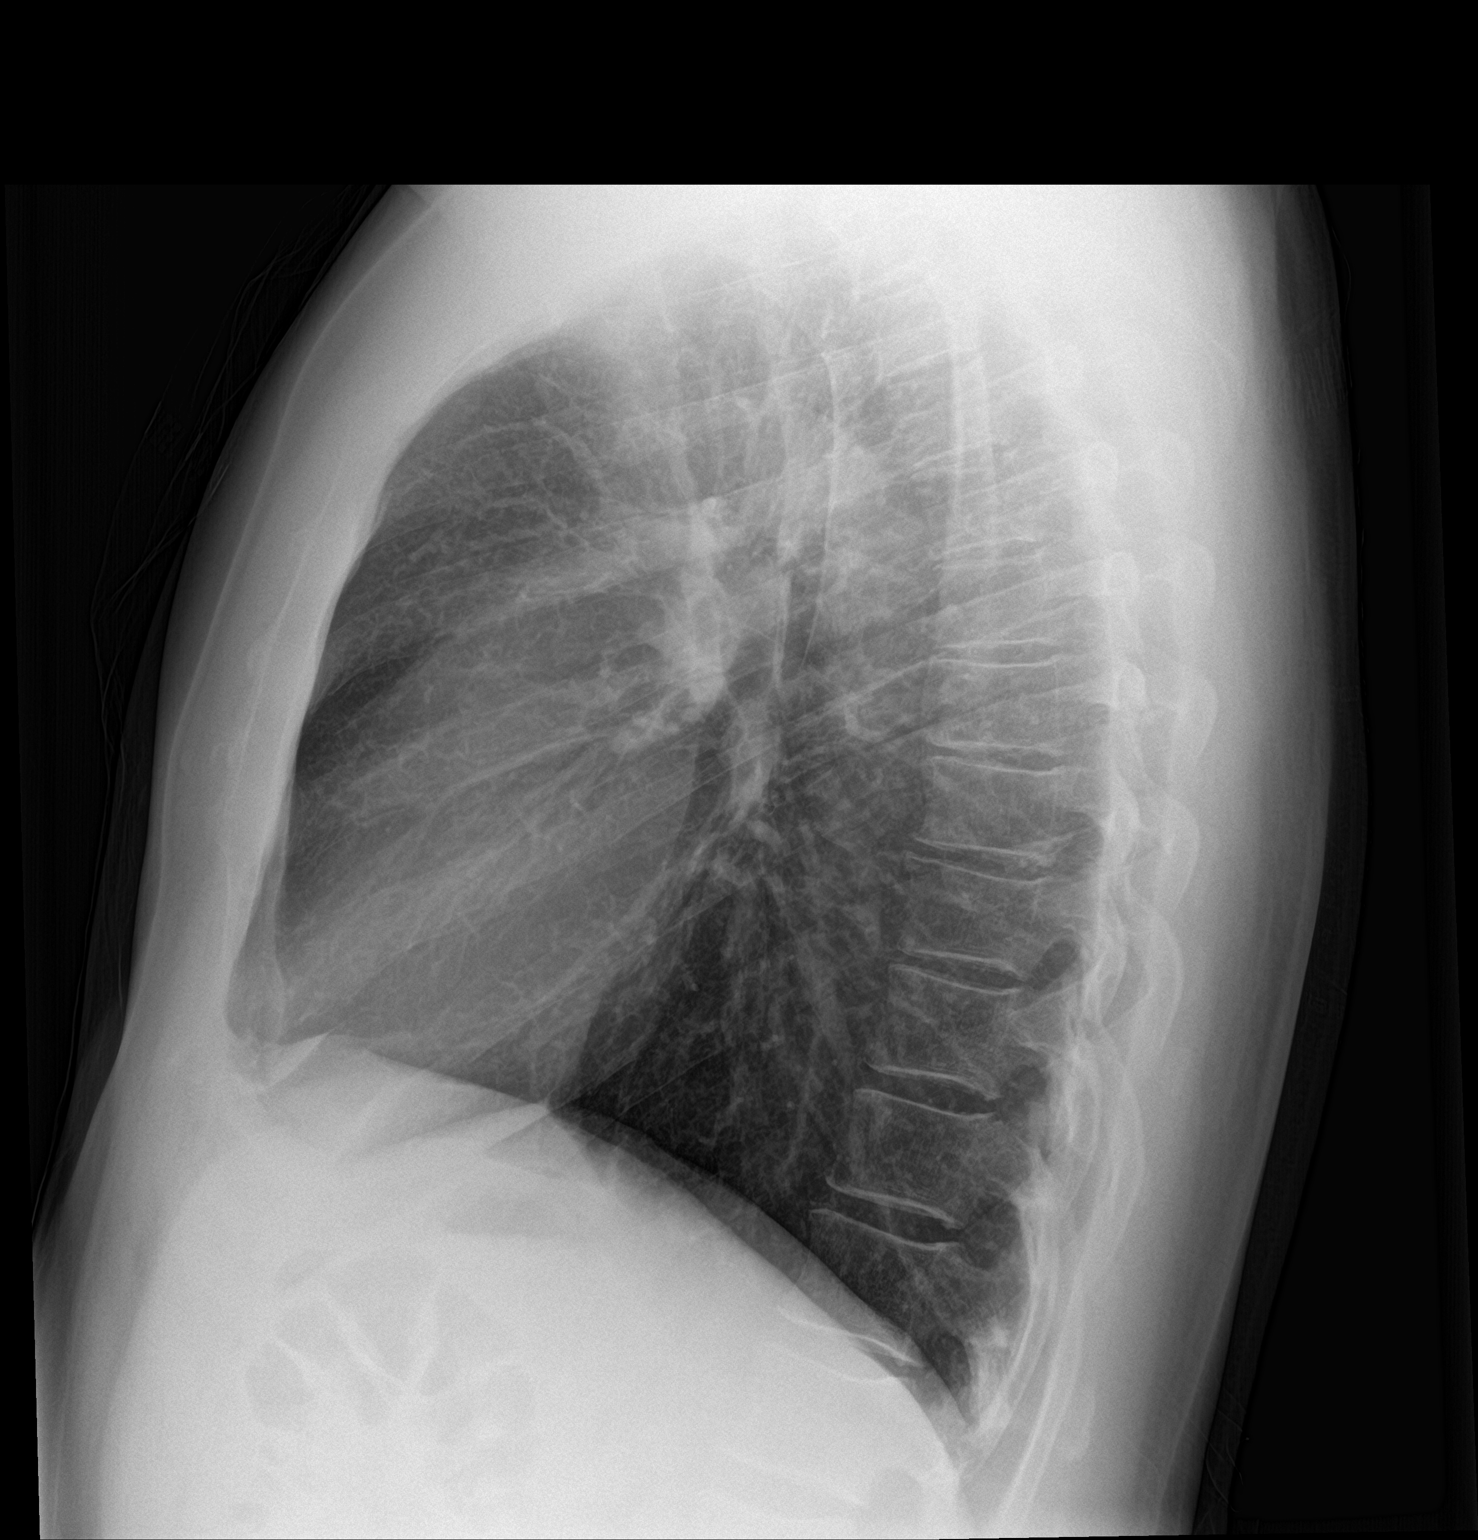

[2 of 2 positions shown; findings below may reference images not displayed]

FINDINGS: Cardiomediastinal silhouette is unremarkable. No infiltrate or
pleural effusion. No pulmonary edema. Bony thorax is unremarkable.
IMPRESSION: No active cardiopulmonary disease.
